# Patient Record
Sex: Female | Born: 1990 | Hispanic: Yes | Marital: Single | Smoking: Former smoker
Health system: Southern US, Community
[De-identification: ages and names within clinical notes are randomized; demographics above are authoritative.]

## PROBLEM LIST (undated history)

## (undated) ENCOUNTER — Inpatient Hospital Stay (HOSPITAL_COMMUNITY): Payer: Self-pay

## (undated) DIAGNOSIS — K219 Gastro-esophageal reflux disease without esophagitis: Secondary | ICD-10-CM

## (undated) DIAGNOSIS — K5792 Diverticulitis of intestine, part unspecified, without perforation or abscess without bleeding: Secondary | ICD-10-CM

## (undated) DIAGNOSIS — I10 Essential (primary) hypertension: Secondary | ICD-10-CM

## (undated) HISTORY — PX: OOPHORECTOMY: SHX86

## (undated) HISTORY — PX: CHOLECYSTECTOMY: SHX55

---

## 2015-12-25 DIAGNOSIS — N926 Irregular menstruation, unspecified: Secondary | ICD-10-CM | POA: Insufficient documentation

## 2016-03-26 DIAGNOSIS — K219 Gastro-esophageal reflux disease without esophagitis: Secondary | ICD-10-CM | POA: Insufficient documentation

## 2016-07-04 DIAGNOSIS — R030 Elevated blood-pressure reading, without diagnosis of hypertension: Secondary | ICD-10-CM | POA: Insufficient documentation

## 2016-07-19 ENCOUNTER — Inpatient Hospital Stay (HOSPITAL_COMMUNITY)
Admission: AD | Admit: 2016-07-19 | Discharge: 2016-07-19 | Disposition: A | Discharge status: Home / Self Care | Payer: Medicaid Other | Source: Ambulatory Visit | Attending: Obstetrics and Gynecology | Admitting: Obstetrics and Gynecology

## 2016-07-19 ENCOUNTER — Encounter (HOSPITAL_COMMUNITY): Payer: Self-pay | Admitting: *Deleted

## 2016-07-19 ENCOUNTER — Inpatient Hospital Stay (HOSPITAL_COMMUNITY): Payer: Medicaid Other

## 2016-07-19 DIAGNOSIS — O26891 Other specified pregnancy related conditions, first trimester: Secondary | ICD-10-CM | POA: Diagnosis not present

## 2016-07-19 DIAGNOSIS — Z3A10 10 weeks gestation of pregnancy: Secondary | ICD-10-CM | POA: Insufficient documentation

## 2016-07-19 DIAGNOSIS — R102 Pelvic and perineal pain: Secondary | ICD-10-CM | POA: Insufficient documentation

## 2016-07-19 DIAGNOSIS — Z6791 Unspecified blood type, Rh negative: Secondary | ICD-10-CM

## 2016-07-19 DIAGNOSIS — N939 Abnormal uterine and vaginal bleeding, unspecified: Secondary | ICD-10-CM | POA: Diagnosis present

## 2016-07-19 DIAGNOSIS — O209 Hemorrhage in early pregnancy, unspecified: Secondary | ICD-10-CM | POA: Diagnosis not present

## 2016-07-19 HISTORY — DX: Diverticulitis of intestine, part unspecified, without perforation or abscess without bleeding: K57.92

## 2016-07-19 HISTORY — DX: Essential (primary) hypertension: I10

## 2016-07-19 LAB — URINALYSIS, ROUTINE W REFLEX MICROSCOPIC
Bilirubin Urine: NEGATIVE
Glucose, UA: NEGATIVE mg/dL
Ketones, ur: NEGATIVE mg/dL
NITRITE: POSITIVE — AB
Protein, ur: 100 mg/dL — AB
pH: 5 (ref 5.0–8.0)

## 2016-07-19 LAB — CBC
HEMATOCRIT: 36.8 % (ref 36.0–46.0)
HEMOGLOBIN: 13 g/dL (ref 12.0–15.0)
MCH: 30.5 pg (ref 26.0–34.0)
MCHC: 35.3 g/dL (ref 30.0–36.0)
MCV: 86.4 fL (ref 78.0–100.0)
Platelets: 248 10*3/uL (ref 150–400)
RBC: 4.26 MIL/uL (ref 3.87–5.11)
RDW: 13.1 % (ref 11.5–15.5)
WBC: 8.4 10*3/uL (ref 4.0–10.5)

## 2016-07-19 LAB — WET PREP, GENITAL
CLUE CELLS WET PREP: NONE SEEN
SPERM: NONE SEEN
TRICH WET PREP: NONE SEEN
YEAST WET PREP: NONE SEEN

## 2016-07-19 LAB — URINE MICROSCOPIC-ADD ON

## 2016-07-19 LAB — HCG, QUANTITATIVE, PREGNANCY: hCG, Beta Chain, Quant, S: 46215 m[IU]/mL — ABNORMAL HIGH (ref <5)

## 2016-07-19 LAB — POCT PREGNANCY, URINE: PREG TEST UR: POSITIVE — AB

## 2016-07-19 MED ORDER — RHO D IMMUNE GLOBULIN 1500 UNIT/2ML IJ SOSY
300.0000 ug | PREFILLED_SYRINGE | Freq: Once | INTRAMUSCULAR | Status: AC
  Administered 2016-07-19: 300 ug via INTRAMUSCULAR
  Filled 2016-07-19: qty 2

## 2016-07-19 NOTE — MAU Note (Signed)
Pt has had pink discharge with wiping for the last 3 weeks, today has become heavier & bright red.  Also having lower abd pain.  Pos HPT 4 weeks ago, also pos UPT [facility].

## 2016-07-19 NOTE — Discharge Instructions (Signed)
Hemorragia vaginal durante el embarazo (primer trimestre)  (Vaginal Bleeding During Pregnancy, First Trimester)  Durante los primeros meses del embarazo es relativamente frecuente que se presente una pequeña hemorragia (manchas). Esta situación generalmente mejora por sí misma. Estas hemorragias o manchas tienen diversas causas al inicio del embarazo. Algunas manchas pueden estar relacionadas al embarazo y otras no. En la mayoría de los casos, la hemorragia es normal y no es un problema. Sin embargo, la hemorragia también puede ser un signo de algo grave. Debe informar a su médico de inmediato si tiene alguna hemorragia vaginal.  Algunas causas posibles de hemorragia vaginal durante el primer trimestre incluyen:  · Infección o inflamación del cuello del útero.  · Crecimientos anormales (pólipos) en el cuello del útero.  · Aborto espontáneo o amenaza de aborto espontáneo.  · Tejido del embarazo se ha desarrollado fuera del útero y en una trompa de falopio (embarazo ectópico).  · Se han desarrollado pequeños quistes en el útero en lugar de tejido de embarazo (embarazo molar).  INSTRUCCIONES PARA EL CUIDADO EN EL HOGAR   Controle su afección para ver si hay cambios. Las siguientes indicaciones ayudarán a aliviar cualquier molestia que pueda sentir:  · Siga las indicaciones del médico para restringir su actividad. Si el médico le indica descanso en la cama, debe quedarse en la cama y levantarse solo para ir al baño. No obstante, el médico puede permitirle que continué con tareas livianas.  · Si es necesario, organícese para que alguien le ayude con las actividades y responsabilidades cotidianas mientras está en cama.  · Lleve un registro de la cantidad y la saturación de las toallas higiénicas que utiliza cada día. Anote este dato.  · No use tampones.No se haga duchas vaginales.  · No tenga relaciones sexuales u orgasmos hasta que el médico la autorice.  · Si elimina tejido por la vagina, guárdelo para mostrárselo al  médico.  · Tome solo medicamentos de venta libre o recetados, según las indicaciones del médico.  · No tome aspirina, ya que puede causar hemorragias.  · Cumpla con todas las visitas de control, según le indique su médico.  SOLICITE ATENCIÓN MÉDICA SI:  · Tiene un sangrado vaginal en cualquier momento del embarazo.  · Tiene calambres o dolores de parto.  · Tiene fiebre que los medicamentos no pueden controlar.  SOLICITE ATENCIÓN MÉDICA DE INMEDIATO SI:   · Siente calambres intensos en la espalda o en el vientre (abdomen).  · Elimina coágulos grandes o tejido por la vagina.  · La hemorragia aumenta.  · Si se siente mareada, débil o se desmaya.  · Tiene escalofríos.  · Tiene una pérdida importante o sale líquido a borbotones por la vagina.  · Se desmaya mientras defeca.  ASEGÚRESE DE QUE:  · Comprende estas instrucciones.  · Controlará su afección.  · Recibirá ayuda de inmediato si no mejora o si empeora.     Esta información no tiene como fin reemplazar el consejo del médico. Asegúrese de hacerle al médico cualquier pregunta que tenga.     Document Released: 07/02/2005 Document Revised: 09/27/2013  Elsevier Interactive Patient Education ©2016 Elsevier Inc.

## 2016-07-19 NOTE — MAU Provider Note (Signed)
Chief Complaint: Vaginal Bleeding and Abdominal Pain   First Provider Initiated Contact with Patient 07/19/16 2056      SUBJECTIVE HPI: Kristine Cordova is a 25 y.o. G2P1001 at [redacted]w[redacted]d by LMP who presents to maternity admissions reporting bleeding x 3 weeks that was light and pink, only when wiping, but became heavy and dark red today. The bleeding is associated with mild cramping that is intermittent. She has not tried any treatments, nothing makes the bleeding better or worse.  She has not had intercourse since bleeding started.   She denies vaginal itching/burning, urinary symptoms, h/a, dizziness, n/v, or fever/chills.     HPI  Past Medical History:  Diagnosis Date  . Diverticulitis   . Hypertension    Past Surgical History:  Procedure Laterality Date  . CHOLECYSTECTOMY    . OOPHORECTOMY     Social History   Social History  . Marital status: Single    Spouse name: N/A  . Number of children: N/A  . Years of education: N/A   Occupational History  . Not on file.   Social History Main Topics  . Smoking status: Never Smoker  . Smokeless tobacco: Never Used  . Alcohol use No  . Drug use: No  . Sexual activity: Yes   Other Topics Concern  . Not on file   Social History Narrative  . No narrative on file   No current facility-administered medications on file prior to encounter.    No current outpatient prescriptions on file prior to encounter.   Allergies not on file  ROS:  Review of Systems  Constitutional: Negative for chills, fatigue and fever.  Respiratory: Negative for shortness of breath.   Cardiovascular: Negative for chest pain.  Genitourinary: Positive for pelvic pain and vaginal bleeding. Negative for difficulty urinating, dysuria, flank pain, vaginal discharge and vaginal pain.  Neurological: Negative for dizziness and headaches.  Psychiatric/Behavioral: Negative.      I have reviewed patient's Past Medical Hx, Surgical Hx, Family Hx, Social Hx,  medications and allergies.   Physical Exam   Patient Vitals for the past 24 hrs:  BP Pulse Resp  07/19/16 2325 117/71 79 18   Constitutional: Well-developed, well-nourished female in no acute distress.  Cardiovascular: normal rate Respiratory: normal effort GI: Abd soft, non-tender. Pos BS x 4 MS: Extremities nontender, no edema, normal ROM Neurologic: Alert and oriented x 4.  GU: Neg CVAT.  PELVIC EXAM: Cervix pink, visually closed, without lesion, moderate amount dark red lbeeding, vaginal walls and external genitalia normal Bimanual exam: Cervix 0/long/high, firm, anterior, neg CMT, uterus nontender, slightly enlarged, adnexa without tenderness, enlargement, or mass   LAB RESULTS  Results for orders placed or performed during the hospital encounter of 07/19/16 (from the past 48 hour(s))  Urinalysis, Routine w reflex microscopic (not at [facility])     Status: Abnormal   Collection Time: 07/19/16  7:48 PM  Result Value Ref Range   Color, Urine RED (A) YELLOW    Comment: BIOCHEMICALS MAY BE AFFECTED BY COLOR   APPearance TURBID (A) CLEAR   Specific Gravity, Urine >1.030 (H) 1.005 - 1.030   pH 5.0 5.0 - 8.0   Glucose, UA NEGATIVE NEGATIVE mg/dL   Hgb urine dipstick LARGE (A) NEGATIVE   Bilirubin Urine NEGATIVE NEGATIVE   Ketones, ur NEGATIVE NEGATIVE mg/dL   Protein, ur 045 (A) NEGATIVE mg/dL   Nitrite POSITIVE (A) NEGATIVE   Leukocytes, UA TRACE (A) NEGATIVE  Urine microscopic-add on     Status: Abnormal  Collection Time: 07/19/16  7:48 PM  Result Value Ref Range   Squamous Epithelial / LPF 6-30 (A) NONE SEEN   WBC, UA 0-5 0 - 5 WBC/hpf   RBC / HPF TOO NUMEROUS TO COUNT 0 - 5 RBC/hpf   Bacteria, UA MANY (A) NONE SEEN   Urine-Other MUCOUS PRESENT   Pregnancy, urine POC     Status: Abnormal   Collection Time: 07/19/16  7:57 PM  Result Value Ref Range   Preg Test, Ur POSITIVE (A) NEGATIVE    Comment:        THE SENSITIVITY OF THIS METHODOLOGY IS >24 mIU/mL   CBC      Status: None   Collection Time: 07/19/16  8:25 PM  Result Value Ref Range   WBC 8.4 4.0 - 10.5 K/uL   RBC 4.26 3.87 - 5.11 MIL/uL   Hemoglobin 13.0 12.0 - 15.0 g/dL   HCT 16.136.8 09.636.0 - 04.546.0 %   MCV 86.4 78.0 - 100.0 fL   MCH 30.5 26.0 - 34.0 pg   MCHC 35.3 30.0 - 36.0 g/dL   RDW 40.913.1 81.111.5 - 91.415.5 %   Platelets 248 150 - 400 K/uL  hCG, quantitative, pregnancy     Status: Abnormal   Collection Time: 07/19/16  8:25 PM  Result Value Ref Range   hCG, Beta Chain, Quant, S 46,215 (H) <5 mIU/mL    Comment:          GEST. AGE      CONC.  (mIU/mL)   <=1 WEEK        5 - 50     2 WEEKS       50 - 500     3 WEEKS       100 - 10,000     4 WEEKS     1,000 - 30,000     5 WEEKS     3,500 - 115,000   6-8 WEEKS     12,000 - 270,000    12 WEEKS     15,000 - 220,000        FEMALE AND NON-PREGNANT FEMALE:     LESS THAN 5 mIU/mL   HIV antibody     Status: None   Collection Time: 07/19/16  8:25 PM  Result Value Ref Range   HIV Screen 4th Generation wRfx Non Reactive Non Reactive    Comment: (NOTE) Performed At: [facility] [address] [place], [place] [id number] Mila HomerHancock William F MD YQ:[id number]:[phone]   ABO/Rh     Status: None   Collection Time: 07/19/16  8:25 PM  Result Value Ref Range   ABO/RH(D) O NEG   Rh IG workup (includes ABO/Rh)     Status: None   Collection Time: 07/19/16  8:25 PM  Result Value Ref Range   Gestational Age(Wks) 9    ABO/RH(D) O NEG    Antibody Screen NEG    Unit Number [id number]    Blood Component Type RHIG    Unit division 00    Status of Unit ISSUED,FINAL    Transfusion Status OK TO TRANSFUSE   Wet prep, genital     Status: Abnormal   Collection Time: 07/19/16  8:55 PM  Result Value Ref Range   Yeast Wet Prep HPF POC NONE SEEN NONE SEEN   Trich, Wet Prep NONE SEEN NONE SEEN   Clue Cells Wet Prep HPF POC NONE SEEN NONE SEEN   WBC, Wet Prep HPF POC FEW (A) NONE SEEN  Comment: MODERATE BACTERIA SEEN   Sperm NONE SEEN    --/--/O NEG, O NEG (10/14  2025)  IMAGING US Ob Comp Less 14 Wks  Result Date: 07/19/2016 CLINICAL DATA:  Acute onset of vaginal bleeding.  Initial encounter. EXAM: OBSTETRIC <14 WK [place] AND TRANSVAGINAL OB US TECHNIQUE: Both transabdominal and transvaginal ultrasound examinations were performed for complete evaluation of the gestation as well as the maternal uterus, adnexal regions, and pelvic cul-de-sac. Transvaginal technique was performed to assess early pregnancy. COMPARISON:  CT of the abdomen and pelvis from 04/05/2016 FINDINGS: Intrauterine gestational sac: Single; visualized and normal in shape. Yolk sac:  Yes Embryo:  Yes Cardiac Activity: Yes Heart Rate: 161  bpm CRL:  2.5 cm   9 w   1 d                  [place] EDC: 02/20/2017 Subchorionic hemorrhage:  None visualized. Maternal uterus/adnexae: The uterus is otherwise unremarkable. The patient is status post right-sided oophorectomy. The left ovary is unremarkable in appearance, measuring 4.4 x 3.2 x 4.0 cm. No suspicious adnexal masses are seen; there is no evidence for ovarian torsion. No free fluid is seen within the pelvic cul-de-sac. IMPRESSION: Single live intrauterine pregnancy noted, with a crown-rump length of 2.5 cm, reflecting a gestational age of [redacted] weeks 1 day. This does not match the gestational age by LMP, and reflects a new estimated date of delivery of Feb 20, 2017. Electronically Signed   By: Roanna Raider M.D.   On: 07/19/2016 21:58   US Ob Transvaginal  Result Date: 07/19/2016 CLINICAL DATA:  Acute onset of vaginal bleeding.  Initial encounter. EXAM: OBSTETRIC <14 WK [place] AND TRANSVAGINAL OB US TECHNIQUE: Both transabdominal and transvaginal ultrasound examinations were performed for complete evaluation of the gestation as well as the maternal uterus, adnexal regions, and pelvic cul-de-sac. Transvaginal technique was performed to assess early pregnancy. COMPARISON:  CT of the abdomen and pelvis from 04/05/2016 FINDINGS: Intrauterine gestational sac: Single;  visualized and normal in shape. Yolk sac:  Yes Embryo:  Yes Cardiac Activity: Yes Heart Rate: 161  bpm CRL:  2.5 cm   9 w   1 d                  [place] EDC: 02/20/2017 Subchorionic hemorrhage:  None visualized. Maternal uterus/adnexae: The uterus is otherwise unremarkable. The patient is status post right-sided oophorectomy. The left ovary is unremarkable in appearance, measuring 4.4 x 3.2 x 4.0 cm. No suspicious adnexal masses are seen; there is no evidence for ovarian torsion. No free fluid is seen within the pelvic cul-de-sac. IMPRESSION: Single live intrauterine pregnancy noted, with a crown-rump length of 2.5 cm, reflecting a gestational age of [redacted] weeks 1 day. This does not match the gestational age by LMP, and reflects a new estimated date of delivery of Feb 20, 2017. Electronically Signed   By: Roanna Raider M.D.   On: 07/19/2016 21:58    MAU Management/MDM: Ordered labs and reviewed results.  IUP with normal FHR noted on [place] today.  No evidence of subchorionic hemorrhage but possibly a small SCH resolved with today's bleeding.  Pt to start prenatal care at [facility].  Treatments in MAU included Rhophylac dose given. Urine positive for nitrites but with significant blood from vaginal bleeding.  Sent for culture. Pt stable at time of discharge.  ASSESSMENT 1. Vaginal bleeding in pregnancy, first trimester   2. Rh negative state in antepartum period, first trimester  PLAN Discharge home   Medication List    You have not been prescribed any medications.    Follow-up Information    Center for [facility] .   Why:  The office will call you with an appointment.  Return to MAU for emergencies. Contact information: [address]  [address]  [place], [place] [address]  [phone]           Sharen Counter Certified Nurse-Midwife 07/20/2016  8:55 PM

## 2016-07-20 LAB — RH IG WORKUP (INCLUDES ABO/RH)
ABO/RH(D): O NEG
Antibody Screen: NEGATIVE
GESTATIONAL AGE(WKS): 9
Unit division: 0

## 2016-07-20 LAB — HIV ANTIBODY (ROUTINE TESTING W REFLEX): HIV SCREEN 4TH GENERATION: NONREACTIVE

## 2016-07-20 LAB — ABO/RH: ABO/RH(D): O NEG

## 2016-07-21 LAB — GC/CHLAMYDIA PROBE AMP (~~LOC~~) NOT AT ARMC
CHLAMYDIA, DNA PROBE: NEGATIVE
NEISSERIA GONORRHEA: NEGATIVE

## 2016-07-28 DIAGNOSIS — B951 Streptococcus, group B, as the cause of diseases classified elsewhere: Secondary | ICD-10-CM | POA: Insufficient documentation

## 2016-07-28 DIAGNOSIS — O2340 Unspecified infection of urinary tract in pregnancy, unspecified trimester: Secondary | ICD-10-CM

## 2016-08-06 ENCOUNTER — Other Ambulatory Visit: Payer: Self-pay | Admitting: Family Medicine

## 2016-08-06 ENCOUNTER — Encounter: Payer: Self-pay | Admitting: Family Medicine

## 2016-08-06 ENCOUNTER — Other Ambulatory Visit (HOSPITAL_COMMUNITY)
Admission: RE | Admit: 2016-08-06 | Discharge: 2016-08-06 | Disposition: A | Discharge status: Home / Self Care | Payer: Medicaid Other | Source: Ambulatory Visit | Attending: Family Medicine | Admitting: Family Medicine

## 2016-08-06 ENCOUNTER — Ambulatory Visit (INDEPENDENT_AMBULATORY_CARE_PROVIDER_SITE_OTHER): Payer: Medicaid Other | Admitting: Family Medicine

## 2016-08-06 VITALS — BP 118/79 | HR 88 | Ht 64.0 in | Wt 238.0 lb

## 2016-08-06 DIAGNOSIS — Z113 Encounter for screening for infections with a predominantly sexual mode of transmission: Secondary | ICD-10-CM

## 2016-08-06 DIAGNOSIS — O26899 Other specified pregnancy related conditions, unspecified trimester: Principal | ICD-10-CM

## 2016-08-06 DIAGNOSIS — O26891 Other specified pregnancy related conditions, first trimester: Secondary | ICD-10-CM

## 2016-08-06 DIAGNOSIS — Z3481 Encounter for supervision of other normal pregnancy, first trimester: Secondary | ICD-10-CM

## 2016-08-06 DIAGNOSIS — N898 Other specified noninflammatory disorders of vagina: Secondary | ICD-10-CM | POA: Diagnosis not present

## 2016-08-06 DIAGNOSIS — Z3480 Encounter for supervision of other normal pregnancy, unspecified trimester: Secondary | ICD-10-CM | POA: Insufficient documentation

## 2016-08-06 NOTE — Progress Notes (Signed)
  Subjective:    Kristine SlaterJessica Cordova is a G2P1001 4676w5d being seen today for her first obstetrical visit.  Her obstetrical history is significant for none. Patient does intend to breast feed. Pregnancy history fully reviewed.  Patient reports green vaginal discharge.  Vitals:   08/06/16 1556 08/06/16 1601  BP: 118/79   Pulse: 88   Weight: 238 lb (108 kg)   Height:  5\' 4"  (1.626 m)    HISTORY: OB History  Gravida Para Term Preterm AB Living  2 1 1     1   SAB TAB Ectopic Multiple Live Births               # Outcome Date GA Lbr Len/2nd Weight Sex Delivery Anes PTL Lv  2 Current           1 Term    8 lb 9 oz (3.884 kg)  Vag-Spont        Past Medical History:  Diagnosis Date  . Diverticulitis   . Hypertension    Past Surgical History:  Procedure Laterality Date  . CHOLECYSTECTOMY    . OOPHORECTOMY     Family History  Problem Relation Age of Onset  . Cancer Maternal Grandfather   . Diabetes Paternal Grandmother      Exam    Uterus:     Pelvic Exam:    Perineum: No Hemorrhoids, Normal Perineum   Vulva: normal, Bartholin's, Urethra, Skene's normal   Vagina:  normal mucosa   Cervix: multiparous appearance   Adnexa: normal adnexa and no mass, fullness, tenderness   Bony Pelvis: gynecoid  System: Breast:  normal appearance, no masses or tenderness   Skin: normal coloration and turgor, no rashes    Neurologic: oriented, gait normal; reflexes normal and symmetric   Extremities: normal strength, tone, and muscle mass   HEENT PERRLA and extra ocular movement intact   Mouth/Teeth mucous membranes moist, pharynx normal without lesions   Neck supple and no masses   Cardiovascular: regular rate and rhythm, no murmurs or gallops   Respiratory:  appears well, vitals normal, no respiratory distress, acyanotic, normal RR, ear and throat exam is normal, neck free of mass or lymphadenopathy, chest clear, no wheezing, crepitations, rhonchi, normal symmetric air entry   Abdomen: soft,  non-tender; bowel sounds normal; no masses,  no organomegaly   Urinary: urethral meatus normal      Assessment:    Pregnancy: G2P1001 Patient Active Problem List   Diagnosis Date Noted  . Supervision of other normal pregnancy, antepartum 08/06/2016        Plan:     Initial labs drawn. Prenatal vitamins. Problem list reviewed and updated. Genetic Screening discussed Quad Screen: ordered.  Ultrasound discussed; fetal survey: requested.  Follow up in 4 weeks. 50% of 30 min visit spent on counseling and coordination of care.  Affirm testing done.   Candelaria CelesteSTINSON, JACOB JEHIEL 08/06/2016

## 2016-08-06 NOTE — Patient Instructions (Signed)
Levonorgestrel intrauterine device (IUD) Qu es este medicamento? El LEVONORGESTREL (DIU) es un dispositivo anticonceptivo (control de natalidad). El dispositivo se coloca dentro del tero por un profesional de la salud. Se utiliza para evitar el embarazo y tambin se puede utilizar para tratar el sangrado abundante que ocurre durante su perodo. Dependiendo del dispositivo, se puede utilizar por 3 a 5 aos. Este medicamento puede ser utilizado para otros usos; si tiene alguna pregunta consulte con su proveedor de atencin mdica o con su farmacutico. Qu le debo informar a mi profesional de la salud antes de tomar este medicamento? Necesita saber si usted presenta alguno de los siguientes problemas o situaciones: -exmen de Papanicolaou anormal -cncer de mama, cuello del tero o tero -diabetes -endometritis -si tiene una infeccin plvica o genital actual o en el pasado -tiene ms de una pareja sexual o si su pareja tiene ms de una pareja -enfermedad cardiaca -antecedente de embarazo tubrico o ectpico -problemas del sistema inmunolgico -DIU colocado -enfermedad heptica o tumor del hgado -problemas con la coagulacin o si toma diluyentes sanguneos -usa medicamentos intravenoso -forma inusual del tero -sangrado vaginal que no tiene explicacin -una reaccin alrgica o inusual al levonorgestrel, a otras hormonas, a la silicona o polietilenos, a otros medicamentos, alimentos, colorantes o conservantes -si est embarazada o buscando quedar embarazada -si est amamantando a un beb Cmo debo utilizar este medicamento? Un profesional de la salud coloca este dispositivo en el tero. Hable con su pediatra para informarse acerca del uso de este medicamento en nios. Puede requerir atencin especial. Sobredosis: Pngase en contacto inmediatamente con un centro toxicolgico o una sala de urgencia si usted cree que haya tomado demasiado medicamento. ATENCIN: Este medicamento es solo  para usted. No comparta este medicamento con nadie. Qu sucede si me olvido de una dosis? No se aplica en este caso. Qu puede interactuar con este medicamento? No tome esta medicina con ninguno de los siguientes medicamentos: -amprenavir -bosentano -fosamprenavir Esta medicina tambin puede interactuar con los siguientes medicamentos: -aprepitant -barbitricos para producir el sueo o para el tratamiento de convulsiones -bexaroteno -griseofulvina -medicamentos para tratar los convulsiones, tales como carbamazepina, etotona, felbamato, oxcarbazepina, fenitona, topiramato -modafinilo -pioglitazona -rifabutina -rifampicina -rifapentina -algunos medicamentos para tratar el virus VIH, tales como atazanavir, indinavir, lopinavir, nelfinavir, tipranavir, ritonavir -hierba de [place] -warfarina Puede ser que esta lista no menciona todas las posibles interacciones. Informe a su profesional de la salud de todos los productos a base de hierbas, medicamentos de venta libre o suplementos nutritivos que est tomando. Si usted fuma, consume bebidas alcohlicas o si utiliza drogas ilegales, indqueselo tambin a su profesional de la salud. Algunas sustancias pueden interactuar con su medicamento. A qu debo estar atento al usar este medicamento? Visite a su mdico o a su profesional de la salud para chequear su evolucin peridicamente. Visite a su mdico si usted o su pareja tiene relaciones sexuales con otras personas, se vuelve VIH positivo o contrae una enfermedad de transmisin sexual. Este medicamento no la protege de la infeccin por VIH (SIDA) ni de ninguna otra enfermedad de transmisin sexual. Puede controlar la ubicacin del DIU usted misma palpando con sus dedos limpios los hilos en la parte anterior de la vagina. No tire de los hilos. Es un buen hbito controlar la ubicacin del dispositivo despus de cada perodo menstrual. Si no slo siente los hilos sino que adems siente otra parte  ms del DIU o si no puede sentir los hilos, consulte a su mdico inmediatamente. El DIU puede salirse   por s solo. Puede quedar embarazada si el dispositivo se sale de su lugar. Utilice un mtodo anticonceptivo adicional, como preservativos, y consulte a su proveedor de atencin mdica s observa que el DIU se sali de su lugar. La utilizacin de tampones no cambia la posicin del DIU y no hay inconvenientes en usarlos durante su perodo. Qu efectos secundarios puedo tener al utilizar este medicamento? Efectos secundarios que debe informar a su mdico o a su profesional de la salud tan pronto como sea posible: -reacciones alrgicas como erupcin cutnea, picazn o urticarias, hinchazn de la cara, labios o lengua -fiebre, sntomas gripales -llagas genitales -alta presin sangunea -ausencia de un perodo menstrual durante 6 semanas mientras lo utiliza -dolor, hinchazn o calor en las piernas -dolor o sensibilidad del plvico -dolor de cabeza repentino o severo -signos de embarazo -calambres estomacales -falta de aliento repentina -problemas de coordinacin, del habla, al caminar -sangrado, flujo vaginal inusual -color amarillento de los ojos o la piel Efectos secundarios que, por lo general, no requieren atencin mdica (debe informarlos a su mdico o a su profesional de la salud si persisten o si son molestos): -acn -dolor de pecho -cambios en el deseo sexual o capacidad -cambios de peso -calambres, mareos o sensacin de desmayo mientras se introduce el dispositivo -dolor de cabeza -sangrado menstruales irregulares en los primeros 3 a 6 meses de usar -nuseas Puede ser que esta lista no menciona todos los posibles efectos secundarios. Comunquese a su mdico por asesoramiento mdico sobre los efectos secundarios. Usted puede informar los efectos secundarios a la FDA por telfono al 1-800-FDA-1088. Dnde debo guardar mi medicina? No se aplica en este caso. ATENCIN: Este folleto es  un resumen. Puede ser que no cubra toda la posible informacin. Si usted tiene preguntas acerca de esta medicina, consulte con su mdico, su farmacutico o su profesional de la salud.    2016, Elsevier/Gold Standard. (2014-11-14 00:00:00)  

## 2016-08-06 NOTE — Progress Notes (Signed)
Having pink spotting and has been seen at [facility].  Today also c/o's "greenish D/C"

## 2016-08-06 NOTE — Progress Notes (Signed)
Bedside ultrasound revealed + FHR= 143bpm. Armandina StammerJennifer Pruitt Taboada RNBSN

## 2016-08-07 LAB — OBSTETRIC PANEL
Antibody Screen: POSITIVE — AB
Basophils Absolute: 0 cells/uL (ref 0–200)
Basophils Relative: 0 %
Eosinophils Absolute: 152 cells/uL (ref 15–500)
Eosinophils Relative: 2 %
HCT: 37 % (ref 35.0–45.0)
Hemoglobin: 12.9 g/dL (ref 11.7–15.5)
Hepatitis B Surface Ag: NEGATIVE
Lymphocytes Relative: 27 %
Lymphs Abs: 2052 cells/uL (ref 850–3900)
MCH: 30.6 pg (ref 27.0–33.0)
MCHC: 34.9 g/dL (ref 32.0–36.0)
MCV: 87.9 fL (ref 80.0–100.0)
MPV: 10.4 fL (ref 7.5–12.5)
Monocytes Absolute: 608 cells/uL (ref 200–950)
Monocytes Relative: 8 %
Neutro Abs: 4788 cells/uL (ref 1500–7800)
Neutrophils Relative %: 63 %
Platelets: 231 10*3/uL (ref 140–400)
RBC: 4.21 MIL/uL (ref 3.80–5.10)
RDW: 13.5 % (ref 11.0–15.0)
Rh Type: NEGATIVE
Rubella: 15.1 Index — ABNORMAL HIGH (ref <0.90)
WBC: 7.6 10*3/uL (ref 3.8–10.8)

## 2016-08-07 LAB — PRENATAL ANTIBODY IDENTIFICATION

## 2016-08-07 LAB — WET PREP BY MOLECULAR PROBE
Candida species: NEGATIVE
Gardnerella vaginalis: POSITIVE — AB
Trichomonas vaginosis: NEGATIVE

## 2016-08-07 LAB — HIV ANTIBODY (ROUTINE TESTING W REFLEX): HIV: NONREACTIVE

## 2016-08-07 LAB — ANTIBODY TITER (PRENATAL TITER): Ab Titer: <2

## 2016-08-08 ENCOUNTER — Telehealth: Payer: Self-pay

## 2016-08-08 ENCOUNTER — Other Ambulatory Visit: Payer: Self-pay | Admitting: Family Medicine

## 2016-08-08 ENCOUNTER — Encounter: Payer: Self-pay | Admitting: Family Medicine

## 2016-08-08 DIAGNOSIS — Z6791 Unspecified blood type, Rh negative: Secondary | ICD-10-CM | POA: Insufficient documentation

## 2016-08-08 DIAGNOSIS — O26899 Other specified pregnancy related conditions, unspecified trimester: Secondary | ICD-10-CM

## 2016-08-08 LAB — GC/CHLAMYDIA PROBE AMP (~~LOC~~) NOT AT ARMC
CHLAMYDIA, DNA PROBE: NEGATIVE
Neisseria Gonorrhea: NEGATIVE

## 2016-08-08 MED ORDER — METRONIDAZOLE 500 MG PO TABS: 500.0000 mg | ORAL_TABLET | Freq: Two times a day (BID) | ORAL | 0 refills | Status: DC

## 2016-08-08 NOTE — Telephone Encounter (Signed)
Patient made aware that she still has infection and needs to be treated. Patient states understanding and will pick up medicine. Kristine StammerJennifer Deina Lipsey RNBSN

## 2016-08-08 NOTE — Telephone Encounter (Signed)
-----   Message from Levie HeritageJacob J Stinson, DO sent at 08/08/2016  8:16 AM EDT ----- Has BV - please notify. Flagyl 500mg  BID x 7 days prescribed.

## 2016-08-09 LAB — PAIN MGMT, OPIATES EXP. QN, UR
CODEINE: NEGATIVE ng/mL (ref <50)
HYDROCODONE: NEGATIVE ng/mL (ref <50)
Hydromorphone: NEGATIVE ng/mL (ref <50)
Morphine: NEGATIVE ng/mL (ref <50)
NORHYDROCODONE: NEGATIVE ng/mL (ref <50)
Noroxycodone: NEGATIVE ng/mL (ref <50)
OXYMORPHONE: NEGATIVE ng/mL (ref <50)
Oxycodone: NEGATIVE ng/mL (ref <50)

## 2016-08-09 LAB — CULTURE, URINE COMPREHENSIVE

## 2016-09-03 ENCOUNTER — Encounter: Payer: Self-pay | Admitting: Obstetrics & Gynecology

## 2016-09-03 ENCOUNTER — Ambulatory Visit (INDEPENDENT_AMBULATORY_CARE_PROVIDER_SITE_OTHER): Payer: Medicaid Other | Admitting: Obstetrics & Gynecology

## 2016-09-03 VITALS — BP 129/75 | HR 90 | Wt 235.0 lb

## 2016-09-03 DIAGNOSIS — J029 Acute pharyngitis, unspecified: Secondary | ICD-10-CM

## 2016-09-03 DIAGNOSIS — O99212 Obesity complicating pregnancy, second trimester: Secondary | ICD-10-CM

## 2016-09-03 DIAGNOSIS — O99210 Obesity complicating pregnancy, unspecified trimester: Secondary | ICD-10-CM

## 2016-09-03 DIAGNOSIS — Z3480 Encounter for supervision of other normal pregnancy, unspecified trimester: Secondary | ICD-10-CM

## 2016-09-03 DIAGNOSIS — Z3482 Encounter for supervision of other normal pregnancy, second trimester: Secondary | ICD-10-CM

## 2016-09-03 DIAGNOSIS — E669 Obesity, unspecified: Secondary | ICD-10-CM

## 2016-09-03 NOTE — Progress Notes (Signed)
   PRENATAL VISIT NOTE  Subjective:  Kristine Cordova is a 25 y.o. H 412P1001(27 yo son at home)  at 8265w5d being seen today for ongoing prenatal care.  She is currently monitored for the following issues for this low-risk pregnancy and has Supervision of other normal pregnancy, antepartum and Rh negative state in antepartum period on her problem list.  Patient reports sore throat, cough for a week, her son was sick also..   Kristine Cordova. Vag. Bleeding: None.  Movement: Absent. Denies leaking of fluid.   The following portions of the patient's history were reviewed and updated as appropriate: allergies, current medications, past family history, past medical history, past social history, past surgical history and problem list. Problem list updated.  Objective:   Vitals:   09/03/16 1417  BP: 129/75  Pulse: 90  Weight: 235 lb (106.6 kg)    Fetal Status:     Movement: Absent     General:  Alert, oriented and cooperative. Patient is in no acute distress.  Skin: Skin is warm and dry. No rash noted.   Cardiovascular: Normal heart rate noted  Respiratory: Normal respiratory effort, no problems with respiration noted  Abdomen: Soft, gravid, appropriate for gestational age.       Pelvic:  Cervical exam deferred        Extremities: Normal range of motion.  Edema: None  Mental Status: Normal mood and affect. Normal behavior. Normal judgment and thought content.   Assessment and Plan:  Pregnancy: G2P1001 at 2965w5d  1. Supervision of other normal pregnancy, antepartum  - US MFM OB COMP + 14 WK; Future - Quad screen at next visit  2. Obesity in pregnancy  - Hemoglobin A1c - Glucose 3. Sore throat- check throat culture  Preterm labor symptoms and general obstetric precautions including but not limited to vaginal bleeding, contractions, leaking of fluid and fetal movement were reviewed in detail with the patient. Please refer to After Visit Summary for other counseling recommendations.  Return in about 4  weeks (around 10/01/2016) for quad screen at next visit.   Allie BossierMyra C Zamia Tyminski, MD

## 2016-09-04 LAB — GLUCOSE, RANDOM: GLUCOSE: 97 mg/dL (ref 65–99)

## 2016-09-04 LAB — HEMOGLOBIN A1C
HEMOGLOBIN A1C: 4.9 % (ref <5.7)
Mean Plasma Glucose: 94 mg/dL

## 2016-09-05 LAB — CULTURE, GROUP A STREP: ORGANISM ID, BACTERIA: NORMAL

## 2016-09-15 ENCOUNTER — Encounter (HOSPITAL_COMMUNITY): Payer: Self-pay | Admitting: Obstetrics & Gynecology

## 2016-09-22 ENCOUNTER — Ambulatory Visit (HOSPITAL_COMMUNITY)
Admission: RE | Admit: 2016-09-22 | Discharge: 2016-09-22 | Disposition: A | Discharge status: Home / Self Care | Payer: Medicaid Other | Source: Ambulatory Visit | Attending: Obstetrics & Gynecology | Admitting: Obstetrics & Gynecology

## 2016-09-22 ENCOUNTER — Other Ambulatory Visit: Payer: Self-pay | Admitting: Obstetrics & Gynecology

## 2016-09-22 DIAGNOSIS — O26892 Other specified pregnancy related conditions, second trimester: Secondary | ICD-10-CM

## 2016-09-22 DIAGNOSIS — Z6791 Unspecified blood type, Rh negative: Secondary | ICD-10-CM

## 2016-09-22 DIAGNOSIS — Z3480 Encounter for supervision of other normal pregnancy, unspecified trimester: Secondary | ICD-10-CM | POA: Diagnosis present

## 2016-09-22 DIAGNOSIS — Z3A18 18 weeks gestation of pregnancy: Secondary | ICD-10-CM

## 2016-09-22 DIAGNOSIS — O99212 Obesity complicating pregnancy, second trimester: Secondary | ICD-10-CM | POA: Diagnosis not present

## 2016-09-22 DIAGNOSIS — O360120 Maternal care for anti-D [Rh] antibodies, second trimester, not applicable or unspecified: Secondary | ICD-10-CM | POA: Insufficient documentation

## 2016-09-22 DIAGNOSIS — Z363 Encounter for antenatal screening for malformations: Secondary | ICD-10-CM

## 2016-10-01 ENCOUNTER — Ambulatory Visit (INDEPENDENT_AMBULATORY_CARE_PROVIDER_SITE_OTHER): Payer: Medicaid Other | Admitting: Obstetrics & Gynecology

## 2016-10-01 VITALS — BP 119/66 | HR 66 | Wt 238.0 lb

## 2016-10-01 DIAGNOSIS — O26899 Other specified pregnancy related conditions, unspecified trimester: Secondary | ICD-10-CM

## 2016-10-01 DIAGNOSIS — E669 Obesity, unspecified: Secondary | ICD-10-CM

## 2016-10-01 DIAGNOSIS — Z3482 Encounter for supervision of other normal pregnancy, second trimester: Secondary | ICD-10-CM

## 2016-10-01 DIAGNOSIS — Z3480 Encounter for supervision of other normal pregnancy, unspecified trimester: Secondary | ICD-10-CM

## 2016-10-01 DIAGNOSIS — Z6791 Unspecified blood type, Rh negative: Secondary | ICD-10-CM

## 2016-10-01 DIAGNOSIS — O99212 Obesity complicating pregnancy, second trimester: Secondary | ICD-10-CM

## 2016-10-01 DIAGNOSIS — O99210 Obesity complicating pregnancy, unspecified trimester: Secondary | ICD-10-CM

## 2016-10-01 NOTE — Progress Notes (Signed)
Pt states the she has been feeling dizzy and sick all day today.

## 2016-10-01 NOTE — Patient Instructions (Signed)
Second Trimester of Pregnancy The second trimester is from week 13 through week 28 (months 4 through 6). The second trimester is often a time when you feel your best. Your body has also adjusted to being pregnant, and you begin to feel better physically. Usually, morning sickness has lessened or quit completely, you may have more energy, and you may have an increase in appetite. The second trimester is also a time when the fetus is growing rapidly. At the end of the sixth month, the fetus is about 9 inches long and weighs about 1 pounds. You will likely begin to feel the baby move (quickening) between 18 and 20 weeks of the pregnancy. Body changes during your second trimester Your body continues to go through many changes during your second trimester. The changes vary from woman to woman.  Your weight will continue to increase. You will notice your lower abdomen bulging out.  You may begin to get stretch marks on your hips, abdomen, and breasts.  You may develop headaches that can be relieved by medicines. The medicines should be approved by your health care provider.  You may urinate more often because the fetus is pressing on your bladder.  You may develop or continue to have heartburn as a result of your pregnancy.  You may develop constipation because certain hormones are causing the muscles that push waste through your intestines to slow down.  You may develop hemorrhoids or swollen, bulging veins (varicose veins).  You may have back pain. This is caused by:  Weight gain.  Pregnancy hormones that are relaxing the joints in your pelvis.  A shift in weight and the muscles that support your balance.  Your breasts will continue to grow and they will continue to become tender.  Your gums may bleed and may be sensitive to brushing and flossing.  Dark spots or blotches (chloasma, mask of pregnancy) may develop on your face. This will likely fade after the baby is born.  A dark line  from your belly button to the pubic area (linea nigra) may appear. This will likely fade after the baby is born.  You may have changes in your hair. These can include thickening of your hair, rapid growth, and changes in texture. Some women also have hair loss during or after pregnancy, or hair that feels dry or thin. Your hair will most likely return to normal after your baby is born. What to expect at prenatal visits During a routine prenatal visit:  You will be weighed to make sure you and the fetus are growing normally.  Your blood pressure will be taken.  Your abdomen will be measured to track your baby's growth.  The fetal heartbeat will be listened to.  Any test results from the previous visit will be discussed. Your health care provider may ask you:  How you are feeling.  If you are feeling the baby move.  If you have had any abnormal symptoms, such as leaking fluid, bleeding, severe headaches, or abdominal cramping.  If you are using any tobacco products, including cigarettes, chewing tobacco, and electronic cigarettes.  If you have any questions. Other tests that may be performed during your second trimester include:  Blood tests that check for:  Low iron levels (anemia).  Gestational diabetes (between 24 and 28 weeks).  Rh antibodies. This is to check for a protein on red blood cells (Rh factor).  Urine tests to check for infections, diabetes, or protein in the urine.  An ultrasound to   confirm the proper growth and development of the baby.  An amniocentesis to check for possible genetic problems.  Fetal screens for spina bifida and Down syndrome.  HIV (human immunodeficiency virus) testing. Routine prenatal testing includes screening for HIV, unless you choose not to have this test. Follow these instructions at home: Eating and drinking  Continue to eat regular, healthy meals.  Avoid raw meat, uncooked cheese, cat litter boxes, and soil used by cats. These  carry germs that can cause birth defects in the baby.  Take your prenatal vitamins.  Take 1500-2000 mg of calcium daily starting at the 20th week of pregnancy until you deliver your baby.  If you develop constipation:  Take over-the-counter or prescription medicines.  Drink enough fluid to keep your urine clear or pale yellow.  Eat foods that are high in fiber, such as fresh fruits and vegetables, whole grains, and beans.  Limit foods that are high in fat and processed sugars, such as fried and sweet foods. Activity  Exercise only as directed by your health care provider. Experiencing uterine cramps is a good sign to stop exercising.  Avoid heavy lifting, wear low heel shoes, and practice good posture.  Wear your seat belt at all times when driving.  Rest with your legs elevated if you have leg cramps or low back pain.  Wear a good support bra for breast tenderness.  Do not use hot tubs, steam rooms, or saunas. Lifestyle  Avoid all smoking, herbs, alcohol, and unprescribed drugs. These chemicals affect the formation and growth of the baby.  Do not use any products that contain nicotine or tobacco, such as cigarettes and e-cigarettes. If you need help quitting, ask your health care provider.  A sexual relationship may be continued unless your health care provider directs you otherwise. General instructions  Follow your health care provider's instructions regarding medicine use. There are medicines that are either safe or unsafe to take during pregnancy.  Take warm sitz baths to soothe any pain or discomfort caused by hemorrhoids. Use hemorrhoid cream if your health care provider approves.  If you develop varicose veins, wear support hose. Elevate your feet for 15 minutes, 3-4 times a day. Limit salt in your diet.  Visit your dentist if you have not gone yet during your pregnancy. Use a soft toothbrush to brush your teeth and be gentle when you floss.  Keep all follow-up  prenatal visits as told by your health care provider. This is important. Contact a health care provider if:  You have dizziness.  You have mild pelvic cramps, pelvic pressure, or nagging pain in the abdominal area.  You have persistent nausea, vomiting, or diarrhea.  You have a bad smelling vaginal discharge.  You have pain with urination. Get help right away if:  You have a fever.  You are leaking fluid from your vagina.  You have spotting or bleeding from your vagina.  You have severe abdominal cramping or pain.  You have rapid weight gain or weight loss.  You have shortness of breath with chest pain.  You notice sudden or extreme swelling of your face, hands, ankles, feet, or legs.  You have not felt your baby move in over an hour.  You have severe headaches that do not go away with medicine.  You have vision changes. Summary  The second trimester is from week 13 through week 28 (months 4 through 6). It is also a time when the fetus is growing rapidly.  Your body goes   through many changes during pregnancy. The changes vary from woman to woman.  Avoid all smoking, herbs, alcohol, and unprescribed drugs. These chemicals affect the formation and growth your baby.  Do not use any tobacco products, such as cigarettes, chewing tobacco, and e-cigarettes. If you need help quitting, ask your health care provider.  Contact your health care provider if you have any questions. Keep all prenatal visits as told by your health care provider. This is important. This information is not intended to replace advice given to you by your health care provider. Make sure you discuss any questions you have with your health care provider. Document Released: 09/16/2001 Document Revised: 02/28/2016 Document Reviewed: 11/23/2012 Elsevier Interactive Patient Education  2017 [employer]  

## 2016-10-01 NOTE — Progress Notes (Signed)
   PRENATAL VISIT NOTE  Subjective:  Kristine Cordova is a 25 y.o. G2P1001 at 4076w5d being seen today for ongoing prenatal care.  She is currently monitored for the following issues for this high-risk pregnancy and has Supervision of other normal pregnancy, antepartum and Rh negative state in antepartum period on her problem list.  Patient reports no complaints.  Contractions: Not present. Vag. Bleeding: None.  Movement: Present. Denies leaking of fluid.   The following portions of the patient's history were reviewed and updated as appropriate: allergies, current medications, past family history, past medical history, past social history, past surgical history and problem list. Problem list updated.  Objective:   Vitals:   10/01/16 1515  BP: 119/66  Pulse: 66  Weight: 238 lb (108 kg)    Fetal Status: Fetal Heart Rate (bpm): 146   Movement: Present     General:  Alert, oriented and cooperative. Patient is in no acute distress.  Skin: Skin is warm and dry. No rash noted.   Cardiovascular: Normal heart rate noted  Respiratory: Normal respiratory effort, no problems with respiration noted  Abdomen: Soft, gravid, appropriate for gestational age. Pain/Pressure: Absent     Pelvic:  Cervical exam deferred        Extremities: Normal range of motion.  Edema: None  Mental Status: Normal mood and affect. Normal behavior. Normal judgment and thought content.   Assessment and Plan:  Pregnancy: G2P1001 at 876w5d  1. Supervision of other normal pregnancy, antepartum  AFP, Quad Screen  2. Rh negative state in antepartum period  Rhogam at 28 weeks Pt did receive Rhogam in early preg due to bleeding.   3. Obesity affecting antepartum state  Spanish interpreter used for visit.     Preterm labor symptoms and general obstetric precautions including but not limited to vaginal bleeding, contractions, leaking of fluid and fetal movement were reviewed in detail with the patient. Please refer to After  Visit Summary for other counseling recommendations.  Return in about 4 weeks (around 10/29/2016).   Willodean Rosenthalarolyn Harraway-Smith, MD

## 2016-10-02 LAB — AFP, QUAD SCREEN
AFP: 32.1 ng/mL
Age Alone: 1:908 {titer}
CURR GEST AGE: 19.7 wk
Down Syndrome Scr Risk Est: 1:13300 {titer}
HCG, Total: 6.79 IU/mL
INH: 116.2 pg/mL
Interpretation-AFP: NEGATIVE
MOM FOR INH: 1.03
MoM for AFP: 0.81
MoM for hCG: 0.47
Open Spina bifida: NEGATIVE
Osb Risk: 1:26400 {titer}
TRI 18 SCR RISK EST: NEGATIVE
UE3 VALUE: 1.81 ng/mL
uE3 Mom: 1.14

## 2016-10-06 NOTE — L&D Delivery Note (Signed)
Delivery Note Pt progressed steadily through labor.  She had another decel X 5 minutes (was on 831mu/min of pitocin), which resolved. She was noted to be C/C/-1 station. She pushed very well, a 10 minute 2nd stage. At 12:07 AM a viable female was delivered via  (Presentation: ROA  ). The shoulders somewhat slow forthcoming, so the posterior (right) axilla was grasped with my index finger, and the baby was rotated clockwise into the oblique diameter.  At this point, the (now) anterior shoulder was released, and the baby delivered.  At no time was any traction placed on the baby's head.   APGAR: 8/9; weight pending  .  After 1 minute, the cord was clamped and cut. 40 units of pitocin diluted in 1000cc LR was infused rapidly IV.  The placenta separated spontaneously and delivered via CCT and maternal pushing effort.  It was inspected and appears to be intact with a 3 VC.  Anesthesia: epidural  Episiotomy:  none Lacerations:  none Suture Repair:  Est. Blood Loss (mL):  100 Mom to postpartum.  Baby to Couplet care / Skin to Skin.  CRESENZO-DISHMAN,Yudith Norlander 02/07/2017, 12:23 AM

## 2016-10-18 ENCOUNTER — Emergency Department (HOSPITAL_BASED_OUTPATIENT_CLINIC_OR_DEPARTMENT_OTHER)
Admission: EM | Admit: 2016-10-18 | Discharge: 2016-10-18 | Disposition: A | Discharge status: Home / Self Care | Payer: Medicaid Other | Attending: Emergency Medicine | Admitting: Emergency Medicine

## 2016-10-18 ENCOUNTER — Encounter (HOSPITAL_BASED_OUTPATIENT_CLINIC_OR_DEPARTMENT_OTHER): Payer: Self-pay | Admitting: Emergency Medicine

## 2016-10-18 DIAGNOSIS — I10 Essential (primary) hypertension: Secondary | ICD-10-CM | POA: Insufficient documentation

## 2016-10-18 DIAGNOSIS — J069 Acute upper respiratory infection, unspecified: Secondary | ICD-10-CM | POA: Diagnosis not present

## 2016-10-18 DIAGNOSIS — R112 Nausea with vomiting, unspecified: Secondary | ICD-10-CM | POA: Diagnosis not present

## 2016-10-18 DIAGNOSIS — R05 Cough: Secondary | ICD-10-CM | POA: Insufficient documentation

## 2016-10-18 DIAGNOSIS — Z87891 Personal history of nicotine dependence: Secondary | ICD-10-CM | POA: Diagnosis not present

## 2016-10-18 DIAGNOSIS — B9789 Other viral agents as the cause of diseases classified elsewhere: Secondary | ICD-10-CM

## 2016-10-18 MED ORDER — CETIRIZINE HCL 10 MG PO TABS: 10.0000 mg | ORAL_TABLET | Freq: Every day | ORAL | 1 refills | Status: DC

## 2016-10-18 MED ORDER — VITAMIN B-6 25 MG PO TABS: 25.0000 mg | ORAL_TABLET | Freq: Every day | ORAL | 0 refills | Status: DC

## 2016-10-18 NOTE — ED Triage Notes (Signed)
Pt c/o cough, congestion since last week; felt hot last night, but dd not measure temp

## 2016-10-18 NOTE — ED Provider Notes (Signed)
[facility]   CSN: [id number] Arrival date & time: 10/18/16  0913     History   Chief Complaint Chief Complaint  Patient presents with  . Cough    HPI Kristine Cordova is a 26 y.o. female.  G2P1 Patient presents to the emergency department with chief complaint of cough and congestion 1 week.  She also reports ear fullness, and sore throat, and rhinorrhea. She states that last night she had one episode of vomiting. She denies any abdominal pain, vaginal discharge, bleeding, or dysuria. She has tried taking Tylenol for her symptoms. There are no other associated symptoms. There are no modifying factors.   The history is provided by the patient. No language interpreter was used.    Past Medical History:  Diagnosis Date  . Diverticulitis   . Hypertension     Patient Active Problem List   Diagnosis Date Noted  . Obesity affecting pregnancy 10/01/2016  . Rh negative state in antepartum period 08/08/2016  . Supervision of other normal pregnancy, antepartum 08/06/2016    Past Surgical History:  Procedure Laterality Date  . CHOLECYSTECTOMY    . OOPHORECTOMY      OB History    Gravida Para Term Preterm AB Living   2 1 1     1    SAB TAB Ectopic Multiple Live Births                   Home Medications    Prior to Admission medications   Medication Sig Start Date End Date Taking? Authorizing Provider  acetaminophen (TYLENOL) 325 MG tablet Take 650 mg by mouth every 6 (six) hours as needed.    Historical Provider, MD  Prenatal Vit-Fe Fumarate-FA (PRENATAL VITAMIN PO) Take by mouth.    Historical Provider, MD    Family History Family History  Problem Relation Age of Onset  . Cancer Maternal Grandfather   . Diabetes Paternal Grandmother     Social History Social History  Substance Use Topics  . Smoking status: Former Smoker    Packs/day: 0.25    Years: 2.00    Types: Cigarettes  . Smokeless tobacco: Never Used  . Alcohol use No      Allergies   Patient has no known allergies.   Review of Systems Review of Systems  Constitutional: Positive for chills and fever.  HENT: Positive for postnasal drip, rhinorrhea, sinus pressure, sneezing and sore throat.   Respiratory: Positive for cough. Negative for shortness of breath.   Cardiovascular: Negative for chest pain.  Gastrointestinal: Positive for nausea and vomiting. Negative for abdominal pain, constipation and diarrhea.  Genitourinary: Negative for dysuria.     Physical Exam Updated Vital Signs BP 112/65 (BP Location: Right Arm)   Pulse 86   Temp 98.2 F (36.8 C) (Oral)   Resp 18   Ht 5\' 4"  (1.626 m)   Wt 108 kg   LMP 05/08/2016 (Approximate)   SpO2 100%   BMI 40.85 kg/m   Physical Exam Physical Exam  Constitutional: Pt  is oriented to person, place, and time. Appears well-developed and well-nourished. No distress.  HENT:  Head: Normocephalic and atraumatic.  Right Ear: Tympanic membrane with mild congestion, no erythema, external ear and ear canal normal.  Left Ear: Tympanic membrane with mild congestion, no erythema, external ear and ear canal normal.  Nose: Mucosal edema and moderate rhinorrhea present. No epistaxis. Right sinus exhibits no maxillary sinus tenderness and no frontal sinus tenderness. Left sinus exhibits no  maxillary sinus tenderness and no frontal sinus tenderness.  Mouth/Throat: Uvula is midline and mucous membranes are normal. Mucous membranes are not pale and not cyanotic. No oropharyngeal exudate, posterior oropharyngeal edema, posterior oropharyngeal erythema or tonsillar abscesses.  Eyes: Conjunctivae are normal. Pupils are equal, round, and reactive to light.  Neck: Normal range of motion and full passive range of motion without pain.  Cardiovascular: Normal rate and intact distal pulses.   Pulmonary/Chest: Effort normal and breath sounds normal. No stridor.  Clear and equal breath sounds without focal wheezes, rhonchi,  rales  Abdominal: No focal abdominal tenderness, no RLQ tenderness or pain at McBurney's point, no RUQ tenderness or Murphy's sign, no left-sided abdominal tenderness, no fluid wave, or signs of peritonitis Musculoskeletal: Normal range of motion.  Lymphadenopathy:    Pthas no cervical adenopathy.  Neurological: Pt is alert and oriented to person, place, and time.  Skin: Skin is warm and dry. No rash noted. Pt is not diaphoretic.  Psychiatric: Normal mood and affect.  Nursing note and vitals reviewed.    ED Treatments / Results  Labs (all labs ordered are listed, but only abnormal results are displayed) Labs Reviewed - No data to display  EKG  EKG Interpretation None       Radiology No results found.  Procedures Procedures (including critical care time)  Medications Ordered in ED Medications - No data to display   Initial Impression / Assessment and Plan / ED Course  I have reviewed the triage vital signs and the nursing notes.  Pertinent labs & imaging results that were available during my care of the patient were reviewed by me and considered in my medical decision making (see chart for details).  Clinical Course     Patients symptoms are consistent with URI, likely viral etiology.  Doubt influenza given that symptoms started 1 week ago and she is not febrile, nor does she have body aches.  Doubt pneumonia, lung sounds are perfectly clear, no productive cough. Discussed that antibiotics are not indicated for viral infections. Pt will be discharged with symptomatic treatment.  Verbalizes understanding and is agreeable with plan. Pt is hemodynamically stable & in NAD prior to dc.   Final Clinical Impressions(s) / ED Diagnoses   Final diagnoses:  Viral URI with cough    New Prescriptions New Prescriptions   CETIRIZINE (ZYRTEC ALLERGY) 10 MG TABLET    Take 1 tablet (10 mg total) by mouth daily.   VITAMIN B-6 (PYRIDOXINE) 25 MG TABLET    Take 1 tablet (25 mg total)  by mouth daily.     Roxy Horseman, PA-C 10/18/16 1478    Charlynne Pander, MD 10/18/16 [phone]

## 2016-10-27 ENCOUNTER — Encounter: Payer: Medicaid Other | Admitting: Family Medicine

## 2016-10-27 ENCOUNTER — Encounter (HOSPITAL_COMMUNITY): Payer: Self-pay | Admitting: *Deleted

## 2016-10-27 ENCOUNTER — Inpatient Hospital Stay (HOSPITAL_COMMUNITY)
Admission: AD | Admit: 2016-10-27 | Discharge: 2016-10-27 | Disposition: A | Discharge status: Home / Self Care | Payer: Medicaid Other | Source: Ambulatory Visit | Attending: Obstetrics & Gynecology | Admitting: Obstetrics & Gynecology

## 2016-10-27 DIAGNOSIS — Z87891 Personal history of nicotine dependence: Secondary | ICD-10-CM | POA: Diagnosis not present

## 2016-10-27 DIAGNOSIS — O26892 Other specified pregnancy related conditions, second trimester: Secondary | ICD-10-CM | POA: Diagnosis present

## 2016-10-27 DIAGNOSIS — R109 Unspecified abdominal pain: Secondary | ICD-10-CM | POA: Insufficient documentation

## 2016-10-27 DIAGNOSIS — W000XXA Fall on same level due to ice and snow, initial encounter: Secondary | ICD-10-CM

## 2016-10-27 DIAGNOSIS — Z79899 Other long term (current) drug therapy: Secondary | ICD-10-CM | POA: Diagnosis not present

## 2016-10-27 DIAGNOSIS — O162 Unspecified maternal hypertension, second trimester: Secondary | ICD-10-CM | POA: Diagnosis not present

## 2016-10-27 DIAGNOSIS — Z3A23 23 weeks gestation of pregnancy: Secondary | ICD-10-CM | POA: Diagnosis not present

## 2016-10-27 DIAGNOSIS — O9A212 Injury, poisoning and certain other consequences of external causes complicating pregnancy, second trimester: Secondary | ICD-10-CM | POA: Diagnosis not present

## 2016-10-27 DIAGNOSIS — Z809 Family history of malignant neoplasm, unspecified: Secondary | ICD-10-CM | POA: Diagnosis not present

## 2016-10-27 DIAGNOSIS — Z9049 Acquired absence of other specified parts of digestive tract: Secondary | ICD-10-CM | POA: Insufficient documentation

## 2016-10-27 DIAGNOSIS — M549 Dorsalgia, unspecified: Secondary | ICD-10-CM | POA: Insufficient documentation

## 2016-10-27 DIAGNOSIS — O99212 Obesity complicating pregnancy, second trimester: Secondary | ICD-10-CM

## 2016-10-27 DIAGNOSIS — S3992XA Unspecified injury of lower back, initial encounter: Secondary | ICD-10-CM | POA: Diagnosis not present

## 2016-10-27 DIAGNOSIS — Z9889 Other specified postprocedural states: Secondary | ICD-10-CM | POA: Insufficient documentation

## 2016-10-27 DIAGNOSIS — Z833 Family history of diabetes mellitus: Secondary | ICD-10-CM | POA: Diagnosis not present

## 2016-10-27 DIAGNOSIS — W009XXA Unspecified fall due to ice and snow, initial encounter: Secondary | ICD-10-CM | POA: Diagnosis not present

## 2016-10-27 LAB — URINALYSIS, ROUTINE W REFLEX MICROSCOPIC
Bilirubin Urine: NEGATIVE
GLUCOSE, UA: 50 mg/dL — AB
Hgb urine dipstick: NEGATIVE
Ketones, ur: 5 mg/dL — AB
NITRITE: NEGATIVE
PH: 6 (ref 5.0–8.0)
Protein, ur: 30 mg/dL — AB
Specific Gravity, Urine: 1.024 (ref 1.005–1.030)

## 2016-10-27 NOTE — MAU Provider Note (Signed)
Chief Complaint:  Fall; Abdominal Pain; and Back Pain   First Provider Initiated Contact with Patient 10/27/16 1425      HPI: Kristine Cordova is a 26 y.o. G2P1001 at [redacted]w[redacted]d who presents to maternity admissions reporting she slipped outside her house today while leaving to go to her prenatal visit. She did not realize there was a patch of ice on the ground and slipped before getting into her car. She fell onto her back and buttocks but scraped her head on some stairs outside her house. She denies hitting her head or abdomen. She reports constant soreness pain in her buttocks and left lower back. She reported some intermittent cramps/contractions when arriving in MAU but reports these have resolved without treatment. She has not tried any treatments, walking makes her back/buttocks pain worse, nothing makes her symptoms better.  She has no associated symptoms. She reports good fetal movement, denies LOF, vaginal bleeding, vaginal itching/burning, urinary symptoms, h/a, dizziness, n/v, or fever/chills.    HPI  Past Medical History: Past Medical History:  Diagnosis Date  . Diverticulitis   . Hypertension     Past obstetric history: OB History  Gravida Para Term Preterm AB Living  2 1 1     1   SAB TAB Ectopic Multiple Live Births               # Outcome Date GA Lbr Len/2nd Weight Sex Delivery Anes PTL Lv  2 Current           1 Term    8 lb 9 oz (3.884 kg)  Vag-Spont         Past Surgical History: Past Surgical History:  Procedure Laterality Date  . CHOLECYSTECTOMY    . OOPHORECTOMY      Family History: Family History  Problem Relation Age of Onset  . Cancer Maternal Grandfather   . Diabetes Paternal Grandmother     Social History: Social History  Substance Use Topics  . Smoking status: Former Smoker    Packs/day: 0.25    Years: 2.00    Types: Cigarettes  . Smokeless tobacco: Former Neurosurgeon  . Alcohol use No    Allergies: No Known Allergies  Meds:  Prescriptions Prior  to Admission  Medication Sig Dispense Refill Last Dose  . Prenatal Vit-Fe Fumarate-FA (PRENATAL VITAMIN PO) Take 1 tablet by mouth daily.    10/27/2016 at Unknown time  . acetaminophen (TYLENOL) 325 MG tablet Take 650 mg by mouth every 6 (six) hours as needed for mild pain or headache.    prn  . cetirizine (ZYRTEC ALLERGY) 10 MG tablet Take 1 tablet (10 mg total) by mouth daily. (Patient not taking: Reported on 10/27/2016) 30 tablet 1 Not Taking at Unknown time  . vitamin B-6 (PYRIDOXINE) 25 MG tablet Take 1 tablet (25 mg total) by mouth daily. (Patient not taking: Reported on 10/27/2016) 10 tablet 0 Not Taking at Unknown time    ROS:  Review of Systems  Constitutional: Negative for chills, fatigue and fever.  Respiratory: Negative for shortness of breath.   Cardiovascular: Negative for chest pain.  Gastrointestinal: Negative for abdominal pain.  Genitourinary: Negative for difficulty urinating, dysuria, flank pain, pelvic pain, vaginal bleeding, vaginal discharge and vaginal pain.  Musculoskeletal: Positive for back pain and myalgias.  Neurological: Negative for dizziness and headaches.  Psychiatric/Behavioral: Negative.      I have reviewed patient's Past Medical Hx, Surgical Hx, Family Hx, Social Hx, medications and allergies.   Physical Exam   Patient  Vitals for the past 24 hrs:  BP Temp Temp src Pulse Resp  10/27/16 1201 137/71 98 F (36.7 C) Oral 89 16   Constitutional: Well-developed, well-nourished female in no acute distress.  Cardiovascular: normal rate Respiratory: normal effort GI: Abd soft, non-tender, gravid appropriate for gestational age.  MS: Extremities nontender, no edema, normal ROM Neurologic: Alert and oriented x 4.  GU: Neg CVAT.  PELVIC EXAM: Deferred     FHT:  Baseline 145 , moderate variability, accelerations present, no decelerations Contractions: None on toco or to palpation   Labs: Results for orders placed or performed during the hospital  encounter of 10/27/16 (from the past 24 hour(s))  Urinalysis, Routine w reflex microscopic     Status: Abnormal   Collection Time: 10/27/16 12:08 PM  Result Value Ref Range   Color, Urine AMBER (A) YELLOW   APPearance CLOUDY (A) CLEAR   Specific Gravity, Urine 1.024 1.005 - 1.030   pH 6.0 5.0 - 8.0   Glucose, UA 50 (A) NEGATIVE mg/dL   Hgb urine dipstick NEGATIVE NEGATIVE   Bilirubin Urine NEGATIVE NEGATIVE   Ketones, ur 5 (A) NEGATIVE mg/dL   Protein, ur 30 (A) NEGATIVE mg/dL   Nitrite NEGATIVE NEGATIVE   Leukocytes, UA SMALL (A) NEGATIVE   RBC / HPF 0-5 0 - 5 RBC/hpf   WBC, UA 0-5 0 - 5 WBC/hpf   Bacteria, UA RARE (A) NONE SEEN   Squamous Epithelial / LPF TOO NUMEROUS TO COUNT (A) NONE SEEN   Mucous PRESENT    O/NEG/-- (11/01 1615)  Imaging:  No results found.  MAU Course/MDM: I have ordered labs and reviewed results.  NST reviewed and reactive x 4 hours of monitoring Treatments in MAU included PO hydration.   Pt with no cramping/contractions, pain only in areas where she impacted ground when she fell.  Will treat for musculoskeletal pain. Rest/ice/heat/warm bath/Tylenol for pain. Pt stable at time of discharge.   Assessment: 1. Traumatic injury during pregnancy in second trimester   2. Obesity affecting pregnancy in second trimester   3. Fall due to ice or snow, initial encounter     Plan: Discharge home  Preterm labor precautions and fetal kick counts Follow-up Information    [facility] [place] [facility].   Specialty:  Obstetrics and Gynecology Why:  As scheduled, return to MAU as needed for emergencies Contact information: [address] [place] [phone]         Allergies as of 10/27/2016   No Known Allergies     Medication List    STOP taking these medications   cetirizine 10 MG tablet Commonly known as:  ZYRTEC ALLERGY   vitamin B-6 25 MG tablet Commonly known  as:  pyridOXINE     TAKE these medications   acetaminophen 325 MG tablet Commonly known as:  TYLENOL Take 650 mg by mouth every 6 (six) hours as needed for mild pain or headache.   PRENATAL VITAMIN PO Take 1 tablet by mouth daily.       Sharen CounterLisa Leftwich-Kirby Certified Nurse-Midwife 10/27/2016 3:18 PM

## 2016-10-27 NOTE — Discharge Instructions (Signed)
What Do I Need to Know About Injuries During Pregnancy? °Trauma is the most common cause of injury and death in pregnant women. This can also result in significant harm or death of the baby. °Your baby is protected in the womb (uterus) by a sac filled with fluid (amniotic sac). Your baby can be harmed if there is direct, high-impact trauma to your abdomen and pelvis. This type of trauma can result in tearing of your uterus, the placenta pulling away from the wall of the uterus (placenta abruption), or the amniotic sac breaking open (rupture of membranes). These injuries can decrease or stop the blood supply to your baby or cause you to go into labor earlier than expected. Minor falls and low-impact automobile accidents do not usually harm your baby, even if they do minimally harm you. °WHAT KIND OF INJURIES CAN AFFECT MY PREGNANCY? °The most common causes of injury or death to a baby include: °· Falls. Falls are more common in the second and third trimester of the pregnancy. Factors that increase your risk of falling include: °¨ Increase in your weight. °¨ The change in your center of gravity. °¨ Tripping over an object that cannot be seen. °¨ Increased looseness (laxity) of your ligaments resulting in less coordinated movements (you may feel clumsy). °¨ Falling during high-risk activities like horseback riding or skiing. °· Automobile accidents. It is important to wear your seat belt properly, with the lap belt below your abdomen, and always practice safe driving. °· Domestic violence or assault. °· Burns (fire or electrical). °The most common causes of injury or death to the pregnant woman include: °· Injuries that cause severe bleeding, shock, and loss of blood flow to major organs. °· Head and neck injuries that result in severe brain or spinal damage. °· Chest trauma that can cause direct injury to the heart and lungs or any injury that affects the area enclosed by the ribs. Trauma to this area can result in  cardiorespiratory arrest. °WHAT CAN I DO TO PROTECT MYSELF AND MY BABY FROM INJURY WHILE I AM PREGNANT? °· Remove slippery rugs and loose objects on the floor that increase your risk of tripping. °· Avoid walking on wet or slippery floors. °· Wear comfortable shoes that have a good grip on the sole. Do not wear high-heeled shoes. °· Always wear your seat belt properly, with the lap belt below your abdomen, and always practice safe driving. Do not ride on a motorcycle while pregnant. °· Do not participate in high-impact activities or sports. °· Avoid fires, starting fires, lifting heavy pots of boiling or hot liquids, and fixing electrical problems. °· Only take over-the-counter or prescription medicines for pain, fever, or discomfort as directed by your health care provider. °· Know your blood type and the father's blood type in case you develop vaginal bleeding or experience an injury for which a blood transfusion may be necessary. °· Call your local emergency services (911 in the [place] if you are a victim of domestic violence or assault. Spousal abuse can be a significant cause of trauma during pregnancy. For help and support, contact the National Domestic Violence Hotline. °WHEN SHOULD I SEEK IMMEDIATE MEDICAL CARE?  °· You fall on your abdomen or experience any high-force accident or injury. °· You have been assaulted (domestic or otherwise). °· You have been in a car accident. °· You develop vaginal bleeding. °· You develop fluid leaking from the vagina. °· You develop uterine contractions (pelvic cramping, pain, or significant low back   pain). °· You become weak or faint, or have uncontrolled vomiting after trauma. °· You had a serious burn. This includes burns to the face, neck, hands, or genitals, or burns greater than the size of your palm anywhere else. °· You develop neck stiffness or pain after a fall or from other trauma. °· You develop a headache or vision problems after a fall or from other  trauma. °· You do not feel the baby moving or the baby is not moving as much as before a fall or other trauma. °This information is not intended to replace advice given to you by your health care provider. Make sure you discuss any questions you have with your health care provider. °Document Released: 10/30/2004 Document Revised: 10/13/2014 Document Reviewed: 06/29/2013 °Elsevier Interactive Patient Education © 2017 [employer] ° °

## 2016-10-27 NOTE — MAU Note (Signed)
Pt states she fell on the way to her car this morning, had clinic appt.  Fell back & fell on L back & buttocks.  Has pain in L lower back & feels occasional hardening in mid abdomen - new since fall.  Denies bleeding or LOF.  Also has abrasion on L forehead.

## 2016-11-05 ENCOUNTER — Ambulatory Visit (INDEPENDENT_AMBULATORY_CARE_PROVIDER_SITE_OTHER): Payer: Medicaid Other | Admitting: Family Medicine

## 2016-11-05 VITALS — BP 116/57 | HR 69 | Wt 241.0 lb

## 2016-11-05 DIAGNOSIS — M9905 Segmental and somatic dysfunction of pelvic region: Secondary | ICD-10-CM

## 2016-11-05 DIAGNOSIS — O9989 Other specified diseases and conditions complicating pregnancy, childbirth and the puerperium: Secondary | ICD-10-CM

## 2016-11-05 DIAGNOSIS — M9904 Segmental and somatic dysfunction of sacral region: Secondary | ICD-10-CM | POA: Diagnosis not present

## 2016-11-05 DIAGNOSIS — O99891 Other specified diseases and conditions complicating pregnancy: Secondary | ICD-10-CM

## 2016-11-05 DIAGNOSIS — O26892 Other specified pregnancy related conditions, second trimester: Secondary | ICD-10-CM | POA: Diagnosis not present

## 2016-11-05 DIAGNOSIS — M549 Dorsalgia, unspecified: Secondary | ICD-10-CM

## 2016-11-05 DIAGNOSIS — M9903 Segmental and somatic dysfunction of lumbar region: Secondary | ICD-10-CM

## 2016-11-05 DIAGNOSIS — Z3482 Encounter for supervision of other normal pregnancy, second trimester: Secondary | ICD-10-CM

## 2016-11-05 DIAGNOSIS — Z3480 Encounter for supervision of other normal pregnancy, unspecified trimester: Secondary | ICD-10-CM

## 2016-11-05 NOTE — Progress Notes (Signed)
   PRENATAL VISIT NOTE  Subjective:  Kristine Cordova is a 26 y.o. G2P1001 at 10752w5d being seen today for ongoing prenatal care.  She is currently monitored for the following issues for this low-risk pregnancy and has Supervision of other normal pregnancy, antepartum; Rh negative state in antepartum period; and Obesity affecting pregnancy on her problem list.  Patient reports backpain after fall, mostly on left, radiates down leg to knee.  Contractions: Not present. Vag. Bleeding: None.  Movement: Present. Denies leaking of fluid.   The following portions of the patient's history were reviewed and updated as appropriate: allergies, current medications, past family history, past medical history, past social history, past surgical history and problem list. Problem list updated.  Objective:   Vitals:   11/05/16 1456  BP: (!) 116/57  Pulse: 69  Weight: 241 lb (109.3 kg)    Fetal Status:     Movement: Present     General:  Alert, oriented and cooperative. Patient is in no acute distress.  Skin: Skin is warm and dry. No rash noted.   Cardiovascular: Normal heart rate noted  Respiratory: Normal respiratory effort, no problems with respiration noted  Abdomen: Soft, gravid, appropriate for gestational age.       Pelvic:  Cervical exam deferred        MSK: Restriction, tenderness, tissue texture changes, and paraspinal spasm in the lumbar spine  Neuro: Moves all four extremities with no focal neurological deficit  Extremities: Normal range of motion.  Edema: None  Mental Status: Normal mood and affect. Normal behavior. Normal judgment and thought content.   OSE: Head   Cervical   Thoracic   Rib   Lumbar L5 ESRR  Sacrum L/L   Pelvis Right ant innom    Assessment and Plan:  Pregnancy: G2P1001 at 3952w5d  1. Supervision of other normal pregnancy, antepartum FH and FHT normal. 28 week labs at next visit.  2. Back pain in pregnancy  3. Somatic dysfunction of lumbar region  4. Somatic  dysfunction of sacral region  5. Somatic dysfunction of pelvis region Received verbal permission to preform OMT.  Pt treated with HVLA with good subjective relief and objective improvement of motion.  Preterm labor symptoms and general obstetric precautions including but not limited to vaginal bleeding, contractions, leaking of fluid and fetal movement were reviewed in detail with the patient. Please refer to After Visit Summary for other counseling recommendations.  No Follow-up on file.  Levie HeritageJacob J Araceli Coufal, DO

## 2016-12-01 ENCOUNTER — Other Ambulatory Visit: Payer: Self-pay | Admitting: *Deleted

## 2016-12-01 ENCOUNTER — Ambulatory Visit (INDEPENDENT_AMBULATORY_CARE_PROVIDER_SITE_OTHER): Payer: Medicaid Other | Admitting: Family Medicine

## 2016-12-01 ENCOUNTER — Other Ambulatory Visit: Payer: Self-pay | Admitting: Family Medicine

## 2016-12-01 VITALS — BP 111/60 | HR 68 | Wt 243.0 lb

## 2016-12-01 DIAGNOSIS — O26899 Other specified pregnancy related conditions, unspecified trimester: Secondary | ICD-10-CM

## 2016-12-01 DIAGNOSIS — O360930 Maternal care for other rhesus isoimmunization, third trimester, not applicable or unspecified: Secondary | ICD-10-CM | POA: Diagnosis not present

## 2016-12-01 DIAGNOSIS — R12 Heartburn: Secondary | ICD-10-CM

## 2016-12-01 DIAGNOSIS — Z3480 Encounter for supervision of other normal pregnancy, unspecified trimester: Secondary | ICD-10-CM

## 2016-12-01 DIAGNOSIS — Z23 Encounter for immunization: Secondary | ICD-10-CM | POA: Diagnosis not present

## 2016-12-01 DIAGNOSIS — Z3490 Encounter for supervision of normal pregnancy, unspecified, unspecified trimester: Secondary | ICD-10-CM

## 2016-12-01 DIAGNOSIS — O26893 Other specified pregnancy related conditions, third trimester: Secondary | ICD-10-CM

## 2016-12-01 DIAGNOSIS — Z6791 Unspecified blood type, Rh negative: Secondary | ICD-10-CM

## 2016-12-01 DIAGNOSIS — R109 Unspecified abdominal pain: Secondary | ICD-10-CM

## 2016-12-01 DIAGNOSIS — Z3483 Encounter for supervision of other normal pregnancy, third trimester: Secondary | ICD-10-CM

## 2016-12-01 MED ORDER — FAMOTIDINE 20 MG PO TABS: 20.0000 mg | ORAL_TABLET | Freq: Two times a day (BID) | ORAL | 3 refills | Status: DC

## 2016-12-01 MED ORDER — RHO D IMMUNE GLOBULIN 1500 UNIT/2ML IJ SOSY
300.0000 ug | PREFILLED_SYRINGE | Freq: Once | INTRAMUSCULAR | Status: AC
  Administered 2016-12-01: 300 ug via INTRAMUSCULAR

## 2016-12-01 NOTE — Progress Notes (Signed)
   PRENATAL VISIT NOTE  Subjective:  Kristine SlaterJessica Cordova is a 26 y.o. G2P1001 at 7156w3d being seen today for ongoing prenatal care.  She is currently monitored for the following issues for this low-risk pregnancy and has Supervision of other normal pregnancy, antepartum; Rh negative state in antepartum period; and Obesity affecting pregnancy on her problem list.  Patient reports heartburn. Also has epigastric pain similar to, but not as severe as when she had diverticulitis. Some diarrhea, but no blood in stool. Pain started yesterday and fairly constant. Contractions: Not present. Vag. Bleeding: None.  Movement: Present. Denies leaking of fluid.   The following portions of the patient's history were reviewed and updated as appropriate: allergies, current medications, past family history, past medical history, past social history, past surgical history and problem list. Problem list updated.  Objective:   Vitals:   12/01/16 0812  BP: 111/60  Pulse: 68  Weight: 243 lb (110.2 kg)    Fetal Status: Fetal Heart Rate (bpm): 154   Movement: Present     General:  Alert, oriented and cooperative. Patient is in no acute distress.  Skin: Skin is warm and dry. No rash noted.   Cardiovascular: Normal heart rate noted  Respiratory: Normal respiratory effort, no problems with respiration noted  Abdomen: Soft, gravid, appropriate for gestational age. Pain/Pressure: Present     Pelvic:  Cervical exam deferred        Extremities: Normal range of motion.  Edema: None  Mental Status: Normal mood and affect. Normal behavior. Normal judgment and thought content.   Assessment and Plan:  Pregnancy: G2P1001 at 6256w3d  1. Supervision of other normal pregnancy, antepartum FHT normal - CBC  2. Rh negative state in antepartum period Rhogam after bloodwork  3. Prenatal care, antepartum - Glucose Tolerance, 2 Hours w/1 Hour - RPR - HIV antibody (with reflex) - Antibody screen; Future  4. Heartburn during  pregnancy in third trimester Pepcid prescribed.  5. Abdominal pain in pregnancy, third trimester Will check CBC. Recommended bland diet. If worsens, advised pt to go to ED for workup.  Preterm labor symptoms and general obstetric precautions including but not limited to vaginal bleeding, contractions, leaking of fluid and fetal movement were reviewed in detail with the patient. Please refer to After Visit Summary for other counseling recommendations.  No Follow-up on file.   Levie HeritageJacob J Aceyn Kathol, DO

## 2016-12-01 NOTE — Addendum Note (Signed)
Addended by: Anell BarrHOWARD, JENNIFER L on: 12/01/2016 11:34 AM   Modules accepted: Orders

## 2016-12-02 LAB — RPR: RPR: NONREACTIVE

## 2016-12-02 LAB — GLUCOSE TOLERANCE, 2 HOURS W/ 1HR
GLUCOSE, 1 HOUR: 161 mg/dL (ref 65–179)
GLUCOSE, 2 HOUR: 132 mg/dL (ref 65–152)
Glucose, Fasting: 89 mg/dL (ref 65–91)

## 2016-12-02 LAB — CBC
Hematocrit: 34.3 % (ref 34.0–46.6)
Hemoglobin: 11.4 g/dL (ref 11.1–15.9)
MCH: 28.4 pg (ref 26.6–33.0)
MCHC: 33.2 g/dL (ref 31.5–35.7)
MCV: 85 fL (ref 79–97)
Platelets: 249 10*3/uL (ref 150–379)
RBC: 4.02 x10E6/uL (ref 3.77–5.28)
RDW: 13.5 % (ref 12.3–15.4)
WBC: 6.8 10*3/uL (ref 3.4–10.8)

## 2016-12-02 LAB — ANTIBODY SCREEN: ANTIBODY SCREEN: NEGATIVE

## 2016-12-02 LAB — HIV ANTIBODY (ROUTINE TESTING W REFLEX): HIV SCREEN 4TH GENERATION: NONREACTIVE

## 2016-12-15 ENCOUNTER — Ambulatory Visit (INDEPENDENT_AMBULATORY_CARE_PROVIDER_SITE_OTHER): Payer: Medicaid Other | Admitting: Family Medicine

## 2016-12-15 ENCOUNTER — Encounter: Payer: Self-pay | Admitting: Family Medicine

## 2016-12-15 VITALS — BP 121/62 | HR 72 | Wt 245.0 lb

## 2016-12-15 DIAGNOSIS — Z3483 Encounter for supervision of other normal pregnancy, third trimester: Secondary | ICD-10-CM

## 2016-12-15 DIAGNOSIS — Z6791 Unspecified blood type, Rh negative: Secondary | ICD-10-CM

## 2016-12-15 DIAGNOSIS — B3731 Acute candidiasis of vulva and vagina: Secondary | ICD-10-CM

## 2016-12-15 DIAGNOSIS — B373 Candidiasis of vulva and vagina: Secondary | ICD-10-CM

## 2016-12-15 DIAGNOSIS — O98813 Other maternal infectious and parasitic diseases complicating pregnancy, third trimester: Secondary | ICD-10-CM

## 2016-12-15 DIAGNOSIS — O26899 Other specified pregnancy related conditions, unspecified trimester: Secondary | ICD-10-CM

## 2016-12-15 DIAGNOSIS — Z3480 Encounter for supervision of other normal pregnancy, unspecified trimester: Secondary | ICD-10-CM

## 2016-12-15 MED ORDER — TERCONAZOLE 0.8 % VA CREA: 1.0000 | TOPICAL_CREAM | Freq: Every day | VAGINAL | 0 refills | Status: DC

## 2016-12-15 NOTE — Progress Notes (Signed)
   PRENATAL VISIT NOTE  Subjective:  Kristine Cordova is a 26 y.o. G2P1001 at 2664w3d being seen today for ongoing prenatal care.  She is currently monitored for the following issues for this low-risk pregnancy and has Supervision of other normal pregnancy, antepartum; Rh negative state in antepartum period; Maternal morbid obesity, antepartum (HCC); Chronic GERD; Group B streptococcus urinary tract infection affecting pregnancy; Prehypertension; and Irregular periods/menstrual cycles on her problem list.  Patient reports vaginal irritation.  Contractions: Not present. Vag. Bleeding: None.  Movement: Present. Denies leaking of fluid.   The following portions of the patient's history were reviewed and updated as appropriate: allergies, current medications, past family history, past medical history, past social history, past surgical history and problem list. Problem list updated.  Objective:   Vitals:   12/15/16 0945  BP: 121/62  Pulse: 72  Weight: 245 lb (111.1 kg)    Fetal Status: Fetal Heart Rate (bpm): 132 Fundal Height: 33 cm Movement: Present     General:  Alert, oriented and cooperative. Patient is in no acute distress.  Skin: Skin is warm and dry. No rash noted.   Cardiovascular: Normal heart rate noted  Respiratory: Normal respiratory effort, no problems with respiration noted  Abdomen: Soft, gravid, appropriate for gestational age. Pain/Pressure: Present     Pelvic:  Cervical exam deferred        Extremities: Normal range of motion.  Edema: None  Mental Status: Normal mood and affect. Normal behavior. Normal judgment and thought content.   Assessment and Plan:  Pregnancy: G2P1001 at 5264w3d  1. Supervision of other normal pregnancy, antepartum Continue routine prenatal care. Avoid carbohydrates  2. Rh negative state in antepartum period S/p Rhogam  3. Vagina, candidiasis Treat with terconazole. - terconazole (TERAZOL 3) 0.8 % vaginal cream; Place 1 applicator vaginally at  bedtime.  Dispense: 20 g; Refill: 0   Preterm labor symptoms and general obstetric precautions including but not limited to vaginal bleeding, contractions, leaking of fluid and fetal movement were reviewed in detail with the patient. Please refer to After Visit Summary for other counseling recommendations.  Return in 2 weeks (on 12/29/2016).   Reva Boresanya S Dairon Procter, MD

## 2016-12-15 NOTE — Patient Instructions (Addendum)
 Lactancia materna (Breastfeeding) Decidir amamantar es una de las mejores elecciones que puede hacer por usted y su beb. El cambio hormonal durante el embarazo produce el desarrollo del tejido mamario y aumenta la cantidad y el tamao de los conductos galactforos. Estas hormonas tambin permiten que las protenas, los azcares y las grasas de la sangre produzcan la leche materna en las glndulas productoras de leche. Las hormonas impiden que la leche materna sea liberada antes del nacimiento del beb, adems de impulsar el flujo de leche luego del nacimiento. Una vez que ha comenzado a amamantar, pensar en el beb, as como la succin o el llanto, pueden estimular la liberacin de leche de las glndulas productoras de leche. LOS BENEFICIOS DE AMAMANTAR Para el beb  La primera leche (calostro) ayuda a mejorar el funcionamiento del sistema digestivo del beb.  La leche tiene anticuerpos que ayudan a prevenir las infecciones en el beb.  El beb tiene una menor incidencia de asma, alergias y del sndrome de muerte sbita del lactante.  Los nutrientes en la leche materna son mejores para el beb que la leche maternizada y estn preparados exclusivamente para cubrir las necesidades del beb.  La leche materna mejora el desarrollo cerebral del beb.  Es menos probable que el beb desarrolle otras enfermedades, como obesidad infantil, asma o diabetes mellitus de tipo 2. Para usted  La lactancia materna favorece el desarrollo de un vnculo muy especial entre la madre y el beb.  Es conveniente. La leche materna siempre est disponible a la temperatura correcta y es econmica.  La lactancia materna ayuda a quemar caloras y a perder el peso ganado durante el embarazo.  Favorece la contraccin del tero al tamao que tena antes del embarazo de manera ms rpida y disminuye el sangrado (loquios) despus del parto.  La lactancia materna contribuye a reducir el riesgo de desarrollar diabetes  mellitus de tipo 2, osteoporosis o cncer de mama o de ovario en el futuro. SIGNOS DE QUE EL BEB EST HAMBRIENTO Primeros signos de hambre  Aumenta su estado de alerta o actividad.  Se estira.  Mueve la cabeza de un lado a otro.  Mueve la cabeza y abre la boca cuando se le toca la mejilla o la comisura de la boca (reflejo de bsqueda).  Aumenta las vocalizaciones, tales como sonidos de succin, se relame los labios, emite arrullos, suspiros, o chirridos.  Mueve la mano hacia la boca.  Se chupa con ganas los dedos o las manos. Signos tardos de hambre  Est agitado.  Llora de manera intermitente. Signos de hambre extrema Los signos de hambre extrema requerirn que lo calme y lo consuele antes de que el beb pueda alimentarse adecuadamente. No espere a que se manifiesten los siguientes signos de hambre extrema para comenzar a amamantar:  Agitacin.  Llanto intenso y fuerte.  Gritos. INFORMACIN BSICA SOBRE LA LACTANCIA MATERNA Iniciacin de la lactancia materna  Encuentre un lugar cmodo para sentarse o acostarse, con un buen respaldo para el cuello y la espalda.  Coloque una almohada o una manta enrollada debajo del beb para acomodarlo a la altura de la mama (si est sentada). Las almohadas para amamantar se han diseado especialmente a fin de servir de apoyo para los brazos y el beb mientras amamanta.  Asegrese de que el abdomen del beb est frente al suyo.  Masajee suavemente la mama. Con las yemas de los dedos, masajee la pared del pecho hacia el pezn en un movimiento circular. Esto estimula el   flujo de leche. Es posible que deba continuar este movimiento mientras amamanta si la leche fluye lentamente.  Sostenga la mama con el pulgar por arriba del pezn y los otros 4 dedos por debajo de la mama. Asegrese de que los dedos se encuentren lejos del pezn y de la boca del beb.  Empuje suavemente los labios del beb con el pezn o con el dedo.  Cuando la boca del  beb se abra lo suficiente, acrquelo rpidamente a la mama e introduzca todo el pezn y la zona oscura que lo rodea (areola), tanto como sea posible, dentro de la boca del beb. ? Debe haber ms areola visible por arriba del labio superior del beb que por debajo del labio inferior. ? La lengua del beb debe estar entre la enca inferior y la mama.  Asegrese de que la boca del beb est en la posicin correcta alrededor del pezn (prendida). Los labios del beb deben crear un sello sobre la mama y estar doblados hacia afuera (invertidos).  Es comn que el beb succione durante 2 a 3 minutos para que comience el flujo de leche materna. Cmo debe prenderse Es muy importante que le ensee al beb cmo prenderse adecuadamente a la mama. Si el beb no se prende adecuadamente, puede causarle dolor en el pezn y reducir la produccin de leche materna, y hacer que el beb tenga un escaso aumento de peso. Adems, si el beb no se prende adecuadamente al pezn, puede tragar aire durante la alimentacin. Esto puede causarle molestias al beb. Hacer eructar al beb al cambiar de mama puede ayudarlo a liberar el aire. Sin embargo, ensearle al beb cmo prenderse a la mama adecuadamente es la mejor manera de evitar que se sienta molesto por tragar aire mientras se alimenta. Signos de que el beb se ha prendido adecuadamente al pezn:  Tironea o succiona de modo silencioso, sin causarle dolor.  Se escucha que traga cada 3 o 4 succiones.  Hay movimientos musculares por arriba y por delante de sus odos al succionar. Signos de que el beb no se ha prendido adecuadamente al pezn:  Hace ruidos de succin o de chasquido mientras se alimenta.  Siente dolor en el pezn. Si cree que el beb no se prendi correctamente, deslice el dedo en la comisura de la boca y colquelo entre las encas del beb para interrumpir la succin. Intente comenzar a amamantar nuevamente. Signos de lactancia materna exitosa Signos del  beb:  Disminuye gradualmente el nmero de succiones o cesa la succin por completo.  Se duerme.  Relaja el cuerpo.  Retiene una pequea cantidad de leche en la boca.  Se desprende solo del pecho. Signos que presenta usted:  Las mamas han aumentado la firmeza, el peso y el tamao 1 a 3 horas despus de amamantar.  Estn ms blandas inmediatamente despus de amamantar.  Un aumento del volumen de leche, y tambin un cambio en su consistencia y color se producen hacia el quinto da de lactancia materna.  Los pezones no duelen, ni estn agrietados ni sangran. Signos de que su beb recibe la cantidad de leche suficiente  Mojar por lo menos 1 o 2 paales durante las primeras 24 horas despus del nacimiento.  Mojar por lo menos 5 o 6 paales cada 24 horas durante la primera semana despus del nacimiento. La orina debe ser transparente o de color amarillo plido a los 5 das despus del nacimiento.  Mojar entre 6 y 8 paales cada 24 horas a medida   que el beb sigue creciendo y desarrollndose.  Defeca al menos 3 veces en 24 horas a los 5 das de vida. La materia fecal debe ser blanda y amarillenta.  Defeca al menos 3 veces en 24 horas a los 7 das de vida. La materia fecal debe ser grumosa y amarillenta.  No registra una prdida de peso mayor del 10% del peso al nacer durante los primeros 3 das de vida.  Aumenta de peso un promedio de 4 a 7onzas (113 a 198g) por semana despus de los 4 das de vida.  Aumenta de peso, diariamente, de manera uniforme a partir de los 5 das de vida, sin registrar prdida de peso despus de las 2semanas de vida. Despus de alimentarse, es posible que el beb regurgite una pequea cantidad. Esto es frecuente. FRECUENCIA Y DURACIN DE LA LACTANCIA MATERNA El amamantamiento frecuente la ayudar a producir ms leche y a prevenir problemas de dolor en los pezones e hinchazn en las mamas. Alimente al beb cuando muestre signos de hambre o si siente la  necesidad de reducir la congestin de las mamas. Esto se denomina "lactancia a demanda". Evite el uso del chupete mientras trabaja para establecer la lactancia (las primeras 4 a 6 semanas despus del nacimiento del beb). Despus de este perodo, podr ofrecerle un chupete. Las investigaciones demostraron que el uso del chupete durante el primer ao de vida del beb disminuye el riesgo de desarrollar el sndrome de muerte sbita del lactante (SMSL). Permita que el nio se alimente en cada mama todo lo que desee. Contine amamantando al beb hasta que haya terminado de alimentarse. Cuando el beb se desprende o se queda dormido mientras se est alimentando de la primera mama, ofrzcale la segunda. Debido a que, con frecuencia, los recin nacidos permanecen somnolientos las primeras semanas de vida, es posible que deba despertar al beb para alimentarlo. Los horarios de lactancia varan de un beb a otro. Sin embargo, las siguientes reglas pueden servir como gua para ayudarla a garantizar que el beb se alimenta adecuadamente:  Se puede amamantar a los recin nacidos (bebs de 4 semanas o menos de vida) cada 1 a 3 horas.  No deben transcurrir ms de 3 horas durante el da o 5 horas durante la noche sin que se amamante a los recin nacidos.  Debe amamantar al beb 8 veces como mnimo en un perodo de 24 horas, hasta que comience a introducir slidos en su dieta, a los 6 meses de vida aproximadamente. EXTRACCIN DE LECHE MATERNA La extraccin y el almacenamiento de la leche materna le permiten asegurarse de que el beb se alimente exclusivamente de leche materna, aun en momentos en los que no puede amamantar. Esto tiene especial importancia si debe regresar al trabajo en el perodo en que an est amamantando o si no puede estar presente en los momentos en que el beb debe alimentarse. Su asesor en lactancia puede orientarla sobre cunto tiempo es seguro almacenar leche materna. El sacaleche es un aparato  que le permite extraer leche de la mama a un recipiente estril. Luego, la leche materna extrada puede almacenarse en un refrigerador o congelador. Algunos sacaleches son manuales, mientras que otros son elctricos. Consulte a su asesor en lactancia qu tipo ser ms conveniente para usted. Los sacaleches se pueden comprar; sin embargo, algunos hospitales y grupos de apoyo a la lactancia materna alquilan sacaleches mensualmente. Un asesor en lactancia puede ensearle cmo extraer leche materna manualmente, en caso de que prefiera no usar un sacaleche.   CMO CUIDAR LAS MAMAS DURANTE LA LACTANCIA MATERNA Los pezones se secan, agrietan y duelen durante la lactancia materna. Las siguientes recomendaciones pueden ayudarla a mantener las mamas humectadas y sanas:  Evite usar jabn en los pezones.  Use un sostn de soporte. Aunque no son esenciales, las camisetas sin mangas o los sostenes especiales para amamantar estn diseados para acceder fcilmente a las mamas, para amamantar sin tener que quitarse todo el sostn o la camiseta. Evite usar sostenes con aro o sostenes muy ajustados.  Seque al aire sus pezones durante 3 a 4minutos despus de amamantar al beb.  Utilice solo apsitos de algodn en el sostn para absorber las prdidas de leche. La prdida de un poco de leche materna entre las tomas es normal.  Utilice lanolina sobre los pezones luego de amamantar. La lanolina ayuda a mantener la humedad normal de la piel. Si usa lanolina pura, no tiene que lavarse los pezones antes de volver a alimentar al beb. La lanolina pura no es txica para el beb. Adems, puede extraer manualmente algunas gotas de leche materna y masajear suavemente esa leche sobre los pezones, para que la leche se seque al aire. Durante las primeras semanas despus de dar a luz, algunas mujeres pueden experimentar hinchazn en las mamas (congestin mamaria). La congestin puede hacer que sienta las mamas pesadas, calientes y  sensibles al tacto. El pico de la congestin ocurre dentro de los 3 a 5 das despus del parto. Las siguientes recomendaciones pueden ayudarla a aliviar la congestin:  Vace por completo las mamas al amamantar o extraer leche. Puede aplicar calor hmedo en las mamas (en la ducha o con toallas hmedas para manos) antes de amamantar o extraer leche. Esto aumenta la circulacin y ayuda a que la leche fluya. Si el beb no vaca por completo las mamas cuando lo amamanta, extraiga la leche restante despus de que haya finalizado.  Use un sostn ajustado (para amamantar o comn) o una camiseta sin mangas durante 1 o 2 das para indicar al cuerpo que disminuya ligeramente la produccin de leche.  Aplique compresas de hielo sobre las mamas, a menos que le resulte demasiado incmodo.  Asegrese de que el beb est prendido y se encuentre en la posicin correcta mientras lo alimenta. Si la congestin persiste luego de 48 horas o despus de seguir estas recomendaciones, comunquese con su mdico o un asesor en lactancia. RECOMENDACIONES GENERALES PARA EL CUIDADO DE LA SALUD DURANTE LA LACTANCIA MATERNA  Consuma alimentos saludables. Alterne comidas y colaciones, y coma 3 de cada una por da. Dado que lo que come afecta la leche materna, es posible que algunas comidas hagan que su beb se vuelva ms irritable de lo habitual. Evite comer este tipo de alimentos si percibe que afectan de manera negativa al beb.  Beba leche, jugos de fruta y agua para satisfacer su sed (aproximadamente 10 vasos al da).  Descanse con frecuencia, reljese y tome sus vitaminas prenatales para evitar la fatiga, el estrs y la anemia.  Contine con los autocontroles de la mama.  Evite masticar y fumar tabaco. Las sustancias qumicas de los cigarrillos que pasan a la leche materna y la exposicin al humo ambiental del tabaco pueden daar al beb.  No consuma alcohol ni drogas, incluida la marihuana. Algunos medicamentos, que  pueden ser perjudiciales para el beb, pueden pasar a travs de la leche materna. Es importante que consulte a su mdico antes de tomar cualquier medicamento, incluidos todos los medicamentos recetados y de venta   libre, as como los suplementos vitamnicos y herbales. Puede quedar embarazada durante la lactancia. Si desea controlar la natalidad, consulte a su mdico cules son las opciones ms seguras para el beb. SOLICITE ATENCIN MDICA SI:  Usted siente que quiere dejar de amamantar o se siente frustrada con la lactancia.  Siente dolor en las mamas o en los pezones.  Sus pezones estn agrietados o sangran.  Sus pechos estn irritados, sensibles o calientes.  Tiene un rea hinchada en cualquiera de las mamas.  Siente escalofros o fiebre.  Tiene nuseas o vmitos.  Presenta una secrecin de otro lquido distinto de la leche materna de los pezones.  Sus mamas no se llenan antes de amamantar al beb para el quinto da despus del parto.  Se siente triste y deprimida.  El beb est demasiado somnoliento como para comer bien.  El beb tiene problemas para dormir.  Moja menos de 3 paales en 24 horas.  Defeca menos de 3 veces en 24 horas.  La piel del beb o la parte blanca de los ojos se vuelven amarillentas.  El beb no ha aumentado de peso a los 5 das de vida.  SOLICITE ATENCIN MDICA DE INMEDIATO SI:  El beb est muy cansado (letargo) y no se quiere despertar para comer.  Le sube la fiebre sin causa.  Esta informacin no tiene como fin reemplazar el consejo del mdico. Asegrese de hacerle al mdico cualquier pregunta que tenga. Document Released: 09/22/2005 Document Revised: 01/14/2016 Document Reviewed: 03/16/2013 Elsevier Interactive Patient Education  2017 [employer]  

## 2016-12-25 ENCOUNTER — Ambulatory Visit (INDEPENDENT_AMBULATORY_CARE_PROVIDER_SITE_OTHER): Payer: Medicaid Other | Admitting: Family Medicine

## 2016-12-25 VITALS — BP 118/59 | HR 66 | Wt 254.0 lb

## 2016-12-25 DIAGNOSIS — Z3480 Encounter for supervision of other normal pregnancy, unspecified trimester: Secondary | ICD-10-CM

## 2016-12-25 DIAGNOSIS — O9989 Other specified diseases and conditions complicating pregnancy, childbirth and the puerperium: Secondary | ICD-10-CM

## 2016-12-25 DIAGNOSIS — Z3483 Encounter for supervision of other normal pregnancy, third trimester: Secondary | ICD-10-CM

## 2016-12-25 DIAGNOSIS — N898 Other specified noninflammatory disorders of vagina: Secondary | ICD-10-CM

## 2016-12-25 MED ORDER — VALACYCLOVIR HCL 500 MG PO TABS: ORAL_TABLET | ORAL | 5 refills | Status: DC

## 2016-12-25 NOTE — Progress Notes (Signed)
   PRENATAL VISIT NOTE  Subjective:  Kristine SlaterJessica Cordova is a 26 y.o. G2P1001 at 1825w6d being seen today for ongoing prenatal care.  She is currently monitored for the following issues for this low-risk pregnancy and has Supervision of other normal pregnancy, antepartum; Rh negative state in antepartum period; Maternal morbid obesity, antepartum (HCC); Chronic GERD; Group B streptococcus urinary tract infection affecting pregnancy; Prehypertension; and Irregular periods/menstrual cycles on her problem list.  Patient reports vaginal lesion in the right upper labia, started a few days ago, but had burning and pain in that area several days prior to the lesions appearing. Has never had this before. .  Contractions: Irritability. Vag. Bleeding: None.  Movement: Present. Denies leaking of fluid.   The following portions of the patient's history were reviewed and updated as appropriate: allergies, current medications, past family history, past medical history, past social history, past surgical history and problem list. Problem list updated.  Objective:   Vitals:   12/25/16 1110  BP: (!) 118/59  Pulse: 66  Weight: 254 lb (115.2 kg)    Fetal Status:     Movement: Present     General:  Alert, oriented and cooperative. Patient is in no acute distress.  Skin: Skin is warm and dry. No rash noted.   Cardiovascular: Normal heart rate noted  Respiratory: Normal respiratory effort, no problems with respiration noted  Abdomen: Soft, gravid, appropriate for gestational age. Pain/Pressure: Present     Pelvic:  Cervical exam deferred       three small (2-323mm) ulcers on the right upper labia majora  Extremities: Normal range of motion.     Mental Status: Normal mood and affect. Normal behavior. Normal judgment and thought content.   Assessment and Plan:  Pregnancy: G2P1001 at 3325w6d  1. Supervision of other normal pregnancy, antepartum FHT and FH normal  2. Vaginal lesion Concerning for HSV. Will get  cultures and blood work. Start Valtrex while waiting for results.  - HSV(herpes smplx)abs-1+2(IgG+IgM)-bld - Herpes simplex virus culture  Preterm labor symptoms and general obstetric precautions including but not limited to vaginal bleeding, contractions, leaking of fluid and fetal movement were reviewed in detail with the patient. Please refer to After Visit Summary for other counseling recommendations.  No Follow-up on file.   Levie HeritageJacob J Vaishnavi Dalby, DO

## 2016-12-25 NOTE — Progress Notes (Signed)
Patient complaining of vaginal bump with itching in the area. Patient first noticed the bump on Tuesday (12-23-16)of this week . Armandina StammerJennifer Artavious Trebilcock RN BSN

## 2016-12-27 LAB — HSV(HERPES SMPLX)ABS-I+II(IGG+IGM)-BLD
HSV 2 GLYCOPROTEIN G AB, IGG: 9.38 {index} — AB (ref 0.00–0.90)
HSVI/II Comb IgM: <0.91 Ratio (ref 0.00–0.90)

## 2016-12-28 LAB — HERPES SIMPLEX VIRUS CULTURE

## 2016-12-29 ENCOUNTER — Encounter: Payer: Self-pay | Admitting: Family Medicine

## 2016-12-29 DIAGNOSIS — B009 Herpesviral infection, unspecified: Secondary | ICD-10-CM | POA: Insufficient documentation

## 2016-12-29 DIAGNOSIS — O98513 Other viral diseases complicating pregnancy, third trimester: Secondary | ICD-10-CM

## 2017-01-01 ENCOUNTER — Encounter: Payer: Self-pay | Admitting: Family Medicine

## 2017-01-01 ENCOUNTER — Ambulatory Visit (INDEPENDENT_AMBULATORY_CARE_PROVIDER_SITE_OTHER): Payer: Medicaid Other | Admitting: Family Medicine

## 2017-01-01 VITALS — BP 118/63 | HR 76 | Wt 247.0 lb

## 2017-01-01 DIAGNOSIS — O26843 Uterine size-date discrepancy, third trimester: Secondary | ICD-10-CM

## 2017-01-01 DIAGNOSIS — B009 Herpesviral infection, unspecified: Secondary | ICD-10-CM

## 2017-01-01 DIAGNOSIS — B951 Streptococcus, group B, as the cause of diseases classified elsewhere: Secondary | ICD-10-CM

## 2017-01-01 DIAGNOSIS — O98513 Other viral diseases complicating pregnancy, third trimester: Secondary | ICD-10-CM

## 2017-01-01 DIAGNOSIS — Z3480 Encounter for supervision of other normal pregnancy, unspecified trimester: Secondary | ICD-10-CM

## 2017-01-01 DIAGNOSIS — O2343 Unspecified infection of urinary tract in pregnancy, third trimester: Secondary | ICD-10-CM

## 2017-01-01 NOTE — Progress Notes (Signed)
   PRENATAL VISIT NOTE  Subjective:  Kristine Cordova is a 26 y.o. G2P1001 at 4019w6d being seen today for ongoing prenatal care.  She is currently monitored for the following issues for this high-risk pregnancy and has Supervision of other normal pregnancy, antepartum; Rh negative state in antepartum period; Maternal morbid obesity, antepartum (HCC); Chronic GERD; Group B streptococcus urinary tract infection affecting pregnancy; Prehypertension; Irregular periods/menstrual cycles; and HSV-2 infection complicating pregnancy, third trimester on her problem list.  Patient reports lesions improving. Continue Valtrex, will start with prophylaxis once completed treatment..  Contractions: Irritability. Vag. Bleeding: None.  Movement: Present. Denies leaking of fluid.   The following portions of the patient's history were reviewed and updated as appropriate: allergies, current medications, past family history, past medical history, past social history, past surgical history and problem list. Problem list updated.  Objective:   Vitals:   01/01/17 0900  BP: 118/63  Pulse: 76  Weight: 247 lb (112 kg)    Fetal Status: Fetal Heart Rate (bpm): 145   Movement: Present     General:  Alert, oriented and cooperative. Patient is in no acute distress.  Skin: Skin is warm and dry. No rash noted.   Cardiovascular: Normal heart rate noted  Respiratory: Normal respiratory effort, no problems with respiration noted  Abdomen: Soft, gravid, appropriate for gestational age. Pain/Pressure: Present     Pelvic:  Cervical exam deferred        Extremities: Normal range of motion.  Edema: None  Mental Status: Normal mood and affect. Normal behavior. Normal judgment and thought content.   Assessment and Plan:  Pregnancy: G2P1001 at 5719w6d  1. Supervision of other normal pregnancy, antepartum FHT normal  2. HSV-2 infection complicating pregnancy, third trimester Continue valtrex.  3. Group B Streptococcus urinary  tract infection affecting pregnancy in third trimester Intrapartum prophylaxis  4. Size of fetus inconsistent with dates in third trimester US for growth   Preterm labor symptoms and general obstetric precautions including but not limited to vaginal bleeding, contractions, leaking of fluid and fetal movement were reviewed in detail with the patient. Please refer to After Visit Summary for other counseling recommendations.  No Follow-up on file.   Kristine HeritageJacob J Sanaiyah Kirchhoff, DO

## 2017-01-13 ENCOUNTER — Ambulatory Visit (HOSPITAL_COMMUNITY)
Admission: RE | Admit: 2017-01-13 | Discharge: 2017-01-13 | Disposition: A | Discharge status: Home / Self Care | Payer: Medicaid Other | Source: Ambulatory Visit | Attending: Family Medicine | Admitting: Family Medicine

## 2017-01-13 DIAGNOSIS — Z3A34 34 weeks gestation of pregnancy: Secondary | ICD-10-CM | POA: Diagnosis not present

## 2017-01-13 DIAGNOSIS — O360130 Maternal care for anti-D [Rh] antibodies, third trimester, not applicable or unspecified: Secondary | ICD-10-CM | POA: Insufficient documentation

## 2017-01-13 DIAGNOSIS — O26843 Uterine size-date discrepancy, third trimester: Secondary | ICD-10-CM | POA: Diagnosis not present

## 2017-01-13 DIAGNOSIS — O99213 Obesity complicating pregnancy, third trimester: Secondary | ICD-10-CM | POA: Insufficient documentation

## 2017-01-13 DIAGNOSIS — Z3480 Encounter for supervision of other normal pregnancy, unspecified trimester: Secondary | ICD-10-CM

## 2017-01-15 ENCOUNTER — Ambulatory Visit (INDEPENDENT_AMBULATORY_CARE_PROVIDER_SITE_OTHER): Payer: Medicaid Other | Admitting: Family Medicine

## 2017-01-15 ENCOUNTER — Encounter: Payer: Self-pay | Admitting: Family Medicine

## 2017-01-15 VITALS — BP 119/72 | HR 78 | Wt 251.0 lb

## 2017-01-15 DIAGNOSIS — O98513 Other viral diseases complicating pregnancy, third trimester: Secondary | ICD-10-CM

## 2017-01-15 DIAGNOSIS — B009 Herpesviral infection, unspecified: Secondary | ICD-10-CM

## 2017-01-15 DIAGNOSIS — Z3480 Encounter for supervision of other normal pregnancy, unspecified trimester: Secondary | ICD-10-CM

## 2017-01-15 DIAGNOSIS — Z3483 Encounter for supervision of other normal pregnancy, third trimester: Secondary | ICD-10-CM

## 2017-01-15 DIAGNOSIS — O26843 Uterine size-date discrepancy, third trimester: Secondary | ICD-10-CM

## 2017-01-15 NOTE — Progress Notes (Signed)
   PRENATAL VISIT NOTE  Subjective:  Kristine Cordova is a 26 y.o. G2P1001 at [redacted]w[redacted]d being seen today for ongoing prenatal care.  She is currently monitored for the following issues for this low-risk pregnancy and has Supervision of other normal pregnancy, antepartum; Rh negative state in antepartum period; Maternal morbid obesity, antepartum (HCC); Chronic GERD; Group B streptococcus urinary tract infection affecting pregnancy; Prehypertension; Irregular periods/menstrual cycles; and HSV-2 infection complicating pregnancy, third trimester on her problem list.  Patient reports occasional contractions.  Contractions: Irritability. Vag. Bleeding: None.  Movement: Present. Denies leaking of fluid.   The following portions of the patient's history were reviewed and updated as appropriate: allergies, current medications, past family history, past medical history, past social history, past surgical history and problem list. Problem list updated.  Objective:   Vitals:   01/15/17 0919  BP: 119/72  Pulse: 78  Weight: 251 lb (113.9 kg)    Fetal Status: Fetal Heart Rate (bpm): 142   Movement: Present     General:  Alert, oriented and cooperative. Patient is in no acute distress.  Skin: Skin is warm and dry. No rash noted.   Cardiovascular: Normal heart rate noted  Respiratory: Normal respiratory effort, no problems with respiration noted  Abdomen: Soft, gravid, appropriate for gestational age. Pain/Pressure: Present     Pelvic:  Cervical exam deferred        Extremities: Normal range of motion.  Edema: Trace  Mental Status: Normal mood and affect. Normal behavior. Normal judgment and thought content.   Assessment and Plan:  Pregnancy: G2P1001 at [redacted]w[redacted]d  1. Supervision of other normal pregnancy, antepartum FHT normal. Discussed LGA baby. Will repeat US in 4 weeks. 36 week cultures at next appt.  2. HSV-2 infection complicating pregnancy, third trimester Continue valtrex.  3. Size of fetus  inconsistent with dates in third trimester - [place] MFM OB FOLLOW UP; Future  Preterm labor symptoms and general obstetric precautions including but not limited to vaginal bleeding, contractions, leaking of fluid and fetal movement were reviewed in detail with the patient. Please refer to After Visit Summary for other counseling recommendations.  No Follow-up on file.   Levie Heritage, DO

## 2017-01-21 ENCOUNTER — Encounter: Payer: Self-pay | Admitting: Family Medicine

## 2017-01-21 ENCOUNTER — Ambulatory Visit (INDEPENDENT_AMBULATORY_CARE_PROVIDER_SITE_OTHER): Payer: Medicaid Other | Admitting: Family Medicine

## 2017-01-21 VITALS — BP 129/58 | HR 77 | Wt 254.0 lb

## 2017-01-21 DIAGNOSIS — B009 Herpesviral infection, unspecified: Secondary | ICD-10-CM

## 2017-01-21 DIAGNOSIS — Z3483 Encounter for supervision of other normal pregnancy, third trimester: Secondary | ICD-10-CM

## 2017-01-21 DIAGNOSIS — O2343 Unspecified infection of urinary tract in pregnancy, third trimester: Secondary | ICD-10-CM

## 2017-01-21 DIAGNOSIS — O98513 Other viral diseases complicating pregnancy, third trimester: Secondary | ICD-10-CM

## 2017-01-21 DIAGNOSIS — O3663X0 Maternal care for excessive fetal growth, third trimester, not applicable or unspecified: Secondary | ICD-10-CM

## 2017-01-21 DIAGNOSIS — B951 Streptococcus, group B, as the cause of diseases classified elsewhere: Secondary | ICD-10-CM

## 2017-01-21 DIAGNOSIS — Z3480 Encounter for supervision of other normal pregnancy, unspecified trimester: Secondary | ICD-10-CM

## 2017-01-21 NOTE — Progress Notes (Signed)
   PRENATAL VISIT NOTE  Subjective:  Kristine Cordova is a 26 y.o. G2P1001 at [redacted]w[redacted]d being seen today for ongoing prenatal care.  She is currently monitored for the following issues for this low-risk pregnancy and has Supervision of other normal pregnancy, antepartum; Rh negative state in antepartum period; Maternal morbid obesity, antepartum (HCC); Chronic GERD; Group B streptococcus urinary tract infection affecting pregnancy; Prehypertension; Irregular periods/menstrual cycles; and HSV-2 infection complicating pregnancy, third trimester on her problem list.  Patient reports difficulty sleeping, occsaional contractions.  Contractions: Irritability. Vag. Bleeding: None.  Movement: Present. Denies leaking of fluid.   The following portions of the patient's history were reviewed and updated as appropriate: allergies, current medications, past family history, past medical history, past social history, past surgical history and problem list. Problem list updated.  Objective:   Vitals:   01/21/17 1509  BP: (!) 129/58  Pulse: 77  Weight: 254 lb (115.2 kg)    Fetal Status: Fetal Heart Rate (bpm): 143   Movement: Present     General:  Alert, oriented and cooperative. Patient is in no acute distress.  Skin: Skin is warm and dry. No rash noted.   Cardiovascular: Normal heart rate noted  Respiratory: Normal respiratory effort, no problems with respiration noted  Abdomen: Soft, gravid, appropriate for gestational age. Pain/Pressure: Present     Pelvic:  Cervical exam deferred        Extremities: Normal range of motion.     Mental Status: Normal mood and affect. Normal behavior. Normal judgment and thought content.   Assessment and Plan:  Pregnancy: G2P1001 at [redacted]w[redacted]d  1. Supervision of other normal pregnancy, antepartum FHT normal.  2. HSV-2 infection complicating pregnancy, third trimester Continue valtrex  3. Fetal macrosomia in third trimester, single or unspecified fetus EFW 3220g at  [redacted]w[redacted]d. Recheck [place] on 5/11. Random CBG 87.  4. GBS bacteriuria Intrapartum treatment  Preterm labor symptoms and general obstetric precautions including but not limited to vaginal bleeding, contractions, leaking of fluid and fetal movement were reviewed in detail with the patient. Please refer to After Visit Summary for other counseling recommendations.  No Follow-up on file.   Levie Heritage, DO

## 2017-01-21 NOTE — Progress Notes (Signed)
Random fingerstick glucose: 87. Armandina Stammer RN BSN

## 2017-01-28 ENCOUNTER — Encounter: Payer: Self-pay | Admitting: Obstetrics & Gynecology

## 2017-01-28 ENCOUNTER — Ambulatory Visit (INDEPENDENT_AMBULATORY_CARE_PROVIDER_SITE_OTHER): Payer: Medicaid Other | Admitting: Obstetrics & Gynecology

## 2017-01-28 VITALS — BP 127/65 | HR 76 | Wt 257.0 lb

## 2017-01-28 DIAGNOSIS — O98513 Other viral diseases complicating pregnancy, third trimester: Secondary | ICD-10-CM

## 2017-01-28 DIAGNOSIS — O3663X0 Maternal care for excessive fetal growth, third trimester, not applicable or unspecified: Secondary | ICD-10-CM

## 2017-01-28 DIAGNOSIS — R030 Elevated blood-pressure reading, without diagnosis of hypertension: Secondary | ICD-10-CM

## 2017-01-28 DIAGNOSIS — Z6791 Unspecified blood type, Rh negative: Secondary | ICD-10-CM

## 2017-01-28 DIAGNOSIS — O99210 Obesity complicating pregnancy, unspecified trimester: Secondary | ICD-10-CM

## 2017-01-28 DIAGNOSIS — O2343 Unspecified infection of urinary tract in pregnancy, third trimester: Secondary | ICD-10-CM

## 2017-01-28 DIAGNOSIS — Z3480 Encounter for supervision of other normal pregnancy, unspecified trimester: Secondary | ICD-10-CM

## 2017-01-28 DIAGNOSIS — B951 Streptococcus, group B, as the cause of diseases classified elsewhere: Secondary | ICD-10-CM

## 2017-01-28 DIAGNOSIS — O99213 Obesity complicating pregnancy, third trimester: Secondary | ICD-10-CM

## 2017-01-28 DIAGNOSIS — O09893 Supervision of other high risk pregnancies, third trimester: Secondary | ICD-10-CM

## 2017-01-28 DIAGNOSIS — O2340 Unspecified infection of urinary tract in pregnancy, unspecified trimester: Secondary | ICD-10-CM

## 2017-01-28 DIAGNOSIS — O26899 Other specified pregnancy related conditions, unspecified trimester: Secondary | ICD-10-CM

## 2017-01-28 DIAGNOSIS — B009 Herpesviral infection, unspecified: Secondary | ICD-10-CM

## 2017-01-28 NOTE — Patient Instructions (Signed)
Natural Childbirth Natural childbirth is going through labor and delivery without any drugs to relieve pain. Additionally, fetal monitors are not used, a delivery is not done, and a surgical cut to enlarge the vaginal opening (episiotomy) is not made. With the help of a birthing professional (midwife or health care provider), you direct your own labor and delivery. Many women choose natural childbirth because it makes them feel more in control and in touch with their labor and delivery. Some woman also choose natural childbirth because they are concerned about medicines affecting them and their babies. Pregnant women with a high-risk pregnancy should not attempt natural childbirth. It is better to deliver the infant in a hospital if an emergency situation arises. Sometimes, a health care provider has to get involved for the health and safety of the mother and infant. Techniques for natural childbirth  The Lamaze method-This method teaches parents that having a baby is normal, healthy, and natural. It also teaches the mother to take a neutral position regarding pain medicine and numbing medicines and to make an informed decision about using these medicines when the time comes.  The Bradley method (also called husband-coached birth)-This method teaches the father or partner to be the birth coach. It encourages the mother to exercise and eat a balanced, nutritious diet. It also involves relaxation and deep breathing exercises and preparing the parents for emergency situations. Methods of dealing with labor pain and delivery  Meditation.  Yoga.  Hypnosis.  Acupuncture.  Massage.  Changing positions (walking, rocking, showering, leaning on birth balls).  Lying in warm water or a whirlpool bath.  Finding an activity that keeps your mind off of the labor pain.  Listening to soft music.  Focusing on a particular object (visual imagery). Before going into labor  Be sure you and your spouse or  partner are in agreement about having a natural childbirth.  Decide if your health care provider or a midwife will deliver your baby.  Decide if you will have your baby in the hospital, at a birthing center, or at home.  If you have children, make plans to have someone take care of them when you go to the hospital or birthing center.  Know the distance and the time it takes to go to the hospital or birthing center. Practice going there and time it to be sure.  Have a bag packed with a nightgown, bathrobe, and toiletries. Be ready to take it with you when you go into labor.  Keep phone numbers of your family and friends handy if you need to call someone when you go into labor.  Your spouse or partner should go to all the natural childbirth technique classes.  Talk with your health care provider about the possibility of a medical emergency and what will happen if that occurs. Advantages of natural childbirth  You are in control of your labor and delivery.  You will not take medicines that could affect you and the baby.  There are no invasive procedures, such as an episiotomy.  You and your spouse or partner will work together, which can increase your bond with each other.  In most delivery centers, your family and friends can be involved in the labor and delivery process. Disadvantages of natural childbirth  You will experience pain during your labor and delivery.  The methods of helping relieve your labor pains may not work for you.  You may feel disappointed if you decide to change your mind during labor and not   have a natural childbirth. After the delivery  You will be very tired.  You will be uncomfortable because of your uterus contracting. You will feel soreness around the vagina.  You may feel cold and shaky. This is a natural reaction. This information is not intended to replace advice given to you by your health care provider. Make sure you discuss any questions you  have with your health care provider. Document Released: 09/04/2008 Document Revised: 02/28/2016 Document Reviewed: 05/30/2013 Elsevier Interactive Patient Education  2017 Elsevier Inc[employer]  

## 2017-01-28 NOTE — Progress Notes (Signed)
   PRENATAL VISIT NOTE  Subjective:  Kristine Cordova is a 26 y.o. G2P1001 at [redacted]w[redacted]d being seen today for ongoing prenatal care.  She is currently monitored for the following issues for this high-risk pregnancy and has Supervision of other normal pregnancy, antepartum; Rh negative state in antepartum period; Maternal morbid obesity, antepartum (HCC); Chronic GERD; Group B streptococcus urinary tract infection affecting pregnancy; Prehypertension; HSV-2 infection complicating pregnancy, third trimester; and Macrosomia on her problem list.  Patient reports no complaints.  Contractions: Irritability. Vag. Bleeding: None.  Movement: Present. Denies leaking of fluid.   The following portions of the patient's history were reviewed and updated as appropriate: allergies, current medications, past family history, past medical history, past social history, past surgical history and problem list. Problem list updated.  Objective:   Vitals:   01/28/17 1526  BP: 127/65  Pulse: 76  Weight: 257 lb (116.6 kg)    Fetal Status: Fetal Heart Rate (bpm): 138 Fundal Height: 48 cm Movement: Present     General:  Alert, oriented and cooperative. Patient is in no acute distress.  Skin: Skin is warm and dry. No rash noted.   Cardiovascular: Normal heart rate noted  Respiratory: Normal respiratory effort, no problems with respiration noted  Abdomen: Soft, gravid, appropriate for gestational age. Pain/Pressure: Present     Pelvic:  Cervical exam deferred        Extremities: Normal range of motion.     Mental Status: Normal mood and affect. Normal behavior. Normal judgment and thought content.   Assessment and Plan:  Pregnancy: G2P1001 at [redacted]w[redacted]d  1. Supervision of other normal pregnancy, antepartum  2. Rh negative state in antepartum period s/p rhogam  3. Maternal morbid obesity, antepartum (HCC)  - [place] MFM OB FOLLOW UP; Future  4. Group B Streptococcus urinary tract infection affecting pregnancy,  antepartum Needs PCN in labor  5. HSV-2 infection complicating pregnancy, third trimester Pt is currently on suppresion  6. Prehypertension BP WNL  7. Macrosomia- suspected  - [place] MFM OB FOLLOW UP; Future I have reviewed with pt possible modes of delivery. At present I would rec an attempt at a vag delivery. She has delivered a ~9# infant prev.  She has a vertical incision that is NOT from a c-section!  I have told her that it is possible with a TOL of labor that I she arrests that she may require a primary c-section. Pt understands. I have reviewed with her the risk of shoulder dystocia. We can discuss this further at her next visit after the next [place].   Preterm labor symptoms and general obstetric precautions including but not limited to vaginal bleeding, contractions, leaking of fluid and fetal movement were reviewed in detail with the patient. Please refer to After Visit Summary for other counseling recommendations.  Return in about 1 week (around 02/04/2017).   Willodean Rosenthal, MD

## 2017-02-04 ENCOUNTER — Encounter: Payer: Medicaid Other | Admitting: Obstetrics & Gynecology

## 2017-02-05 ENCOUNTER — Ambulatory Visit (HOSPITAL_COMMUNITY)
Admission: RE | Admit: 2017-02-05 | Discharge: 2017-02-05 | Disposition: A | Discharge status: Home / Self Care | Payer: Medicaid Other | Source: Ambulatory Visit | Attending: Family Medicine | Admitting: Family Medicine

## 2017-02-05 ENCOUNTER — Other Ambulatory Visit: Payer: Self-pay | Admitting: Family Medicine

## 2017-02-05 DIAGNOSIS — O26843 Uterine size-date discrepancy, third trimester: Secondary | ICD-10-CM

## 2017-02-05 DIAGNOSIS — O99213 Obesity complicating pregnancy, third trimester: Secondary | ICD-10-CM | POA: Insufficient documentation

## 2017-02-05 DIAGNOSIS — Z3A37 37 weeks gestation of pregnancy: Secondary | ICD-10-CM

## 2017-02-06 ENCOUNTER — Inpatient Hospital Stay (HOSPITAL_COMMUNITY)
Admission: AD | Admit: 2017-02-06 | Discharge: 2017-02-08 | DRG: 774 | Disposition: A | Discharge status: Home / Self Care | Payer: Medicaid Other | Source: Ambulatory Visit | Attending: Obstetrics and Gynecology | Admitting: Obstetrics and Gynecology

## 2017-02-06 ENCOUNTER — Encounter (HOSPITAL_COMMUNITY): Payer: Self-pay | Admitting: *Deleted

## 2017-02-06 ENCOUNTER — Inpatient Hospital Stay (HOSPITAL_COMMUNITY): Payer: Medicaid Other | Admitting: Anesthesiology

## 2017-02-06 ENCOUNTER — Ambulatory Visit (INDEPENDENT_AMBULATORY_CARE_PROVIDER_SITE_OTHER): Payer: Medicaid Other | Admitting: Obstetrics & Gynecology

## 2017-02-06 VITALS — BP 126/69 | HR 68 | Wt 257.0 lb

## 2017-02-06 DIAGNOSIS — O1002 Pre-existing essential hypertension complicating childbirth: Secondary | ICD-10-CM | POA: Diagnosis present

## 2017-02-06 DIAGNOSIS — O99214 Obesity complicating childbirth: Secondary | ICD-10-CM | POA: Diagnosis present

## 2017-02-06 DIAGNOSIS — Z6841 Body Mass Index (BMI) 40.0 and over, adult: Secondary | ICD-10-CM

## 2017-02-06 DIAGNOSIS — O9832 Other infections with a predominantly sexual mode of transmission complicating childbirth: Secondary | ICD-10-CM | POA: Diagnosis present

## 2017-02-06 DIAGNOSIS — O3663X0 Maternal care for excessive fetal growth, third trimester, not applicable or unspecified: Secondary | ICD-10-CM | POA: Diagnosis present

## 2017-02-06 DIAGNOSIS — K5792 Diverticulitis of intestine, part unspecified, without perforation or abscess without bleeding: Secondary | ICD-10-CM | POA: Diagnosis present

## 2017-02-06 DIAGNOSIS — O99210 Obesity complicating pregnancy, unspecified trimester: Secondary | ICD-10-CM

## 2017-02-06 DIAGNOSIS — O99213 Obesity complicating pregnancy, third trimester: Secondary | ICD-10-CM

## 2017-02-06 DIAGNOSIS — O4202 Full-term premature rupture of membranes, onset of labor within 24 hours of rupture: Secondary | ICD-10-CM | POA: Diagnosis present

## 2017-02-06 DIAGNOSIS — O26893 Other specified pregnancy related conditions, third trimester: Secondary | ICD-10-CM | POA: Diagnosis present

## 2017-02-06 DIAGNOSIS — B951 Streptococcus, group B, as the cause of diseases classified elsewhere: Secondary | ICD-10-CM

## 2017-02-06 DIAGNOSIS — O2343 Unspecified infection of urinary tract in pregnancy, third trimester: Secondary | ICD-10-CM

## 2017-02-06 DIAGNOSIS — K219 Gastro-esophageal reflux disease without esophagitis: Secondary | ICD-10-CM | POA: Diagnosis present

## 2017-02-06 DIAGNOSIS — Z3480 Encounter for supervision of other normal pregnancy, unspecified trimester: Secondary | ICD-10-CM

## 2017-02-06 DIAGNOSIS — Z6791 Unspecified blood type, Rh negative: Secondary | ICD-10-CM

## 2017-02-06 DIAGNOSIS — O9962 Diseases of the digestive system complicating childbirth: Secondary | ICD-10-CM | POA: Diagnosis present

## 2017-02-06 DIAGNOSIS — Z87891 Personal history of nicotine dependence: Secondary | ICD-10-CM

## 2017-02-06 DIAGNOSIS — Z3A38 38 weeks gestation of pregnancy: Secondary | ICD-10-CM | POA: Diagnosis not present

## 2017-02-06 DIAGNOSIS — A6000 Herpesviral infection of urogenital system, unspecified: Secondary | ICD-10-CM | POA: Diagnosis present

## 2017-02-06 LAB — URINALYSIS, ROUTINE W REFLEX MICROSCOPIC
Bilirubin Urine: NEGATIVE
Glucose, UA: NEGATIVE mg/dL
KETONES UR: 20 mg/dL — AB
Nitrite: NEGATIVE
PH: 6 (ref 5.0–8.0)
PROTEIN: NEGATIVE mg/dL
Specific Gravity, Urine: 1.025 (ref 1.005–1.030)

## 2017-02-06 LAB — TYPE AND SCREEN
ABO/RH(D): O NEG
ANTIBODY SCREEN: NEGATIVE

## 2017-02-06 LAB — CBC
HEMATOCRIT: 32.1 % — AB (ref 36.0–46.0)
HEMOGLOBIN: 10.5 g/dL — AB (ref 12.0–15.0)
MCH: 26.9 pg (ref 26.0–34.0)
MCHC: 32.7 g/dL (ref 30.0–36.0)
MCV: 82.1 fL (ref 78.0–100.0)
Platelets: 250 10*3/uL (ref 150–400)
RBC: 3.91 MIL/uL (ref 3.87–5.11)
RDW: 14.3 % (ref 11.5–15.5)
WBC: 8.5 10*3/uL (ref 4.0–10.5)

## 2017-02-06 LAB — COMPREHENSIVE METABOLIC PANEL
ALBUMIN: 2.8 g/dL — AB (ref 3.5–5.0)
ALK PHOS: 190 U/L — AB (ref 38–126)
ALT: 30 U/L (ref 14–54)
ANION GAP: 9 (ref 5–15)
AST: 24 U/L (ref 15–41)
BUN: 10 mg/dL (ref 6–20)
CALCIUM: 8.7 mg/dL — AB (ref 8.9–10.3)
CHLORIDE: 106 mmol/L (ref 101–111)
CO2: 19 mmol/L — AB (ref 22–32)
CREATININE: 0.54 mg/dL (ref 0.44–1.00)
GFR calc non Af Amer: >60 mL/min (ref >60)
GLUCOSE: 110 mg/dL — AB (ref 65–99)
Potassium: 3.7 mmol/L (ref 3.5–5.1)
SODIUM: 134 mmol/L — AB (ref 135–145)
Total Bilirubin: 0.5 mg/dL (ref 0.3–1.2)
Total Protein: 6.5 g/dL (ref 6.5–8.1)

## 2017-02-06 LAB — PROTEIN / CREATININE RATIO, URINE
Creatinine, Urine: 173 mg/dL
PROTEIN CREATININE RATIO: 0.17 mg/mg{creat} — AB (ref 0.00–0.15)
TOTAL PROTEIN, URINE: 29 mg/dL

## 2017-02-06 LAB — POCT FERN TEST: POCT Fern Test: POSITIVE

## 2017-02-06 MED ORDER — DIPHENHYDRAMINE HCL 50 MG/ML IJ SOLN: 12.5000 mg | INTRAMUSCULAR | Status: DC | PRN

## 2017-02-06 MED ORDER — EPHEDRINE 5 MG/ML INJ: 10.0000 mg | INTRAVENOUS | Status: DC | PRN

## 2017-02-06 MED ORDER — PHENYLEPHRINE 40 MCG/ML (10ML) SYRINGE FOR IV PUSH (FOR BLOOD PRESSURE SUPPORT)
80.0000 ug | PREFILLED_SYRINGE | INTRAVENOUS | Status: DC | PRN
  Filled 2017-02-06: qty 5
  Filled 2017-02-06: qty 10

## 2017-02-06 MED ORDER — FENTANYL 2.5 MCG/ML BUPIVACAINE 1/10 % EPIDURAL INFUSION (WH - ANES)
14.0000 mL/h | INTRAMUSCULAR | Status: DC | PRN
  Administered 2017-02-06: 12 mL/h via EPIDURAL
  Filled 2017-02-06: qty 100

## 2017-02-06 MED ORDER — PENICILLIN G POT IN DEXTROSE 60000 UNIT/ML IV SOLN
3.0000 10*6.[IU] | INTRAVENOUS | Status: DC
  Administered 2017-02-06: 3 10*6.[IU] via INTRAVENOUS
  Filled 2017-02-06 (×8): qty 50

## 2017-02-06 MED ORDER — LACTATED RINGERS IV SOLN: 500.0000 mL | Freq: Once | INTRAVENOUS | Status: DC

## 2017-02-06 MED ORDER — PHENYLEPHRINE 40 MCG/ML (10ML) SYRINGE FOR IV PUSH (FOR BLOOD PRESSURE SUPPORT): 80.0000 ug | PREFILLED_SYRINGE | INTRAVENOUS | Status: DC | PRN

## 2017-02-06 MED ORDER — PENICILLIN G POTASSIUM 5000000 UNITS IJ SOLR
5.0000 10*6.[IU] | Freq: Once | INTRAMUSCULAR | Status: AC
  Administered 2017-02-06: 5 10*6.[IU] via INTRAVENOUS
  Filled 2017-02-06: qty 5

## 2017-02-06 MED ORDER — LIDOCAINE HCL (PF) 1 % IJ SOLN
INTRAMUSCULAR | Status: DC | PRN
  Administered 2017-02-06 (×2): 6 mL via EPIDURAL

## 2017-02-06 MED ORDER — FENTANYL CITRATE (PF) 100 MCG/2ML IJ SOLN: 100.0000 ug | INTRAMUSCULAR | Status: DC | PRN

## 2017-02-06 MED ORDER — PHENYLEPHRINE 40 MCG/ML (10ML) SYRINGE FOR IV PUSH (FOR BLOOD PRESSURE SUPPORT)
80.0000 ug | PREFILLED_SYRINGE | INTRAVENOUS | Status: DC | PRN
  Filled 2017-02-06: qty 5

## 2017-02-06 MED ORDER — OXYTOCIN 40 UNITS IN LACTATED RINGERS INFUSION - SIMPLE MED
1.0000 m[IU]/min | INTRAVENOUS | Status: DC
  Administered 2017-02-06: 2 m[IU]/min via INTRAVENOUS
  Administered 2017-02-07: 999 m[IU]/h via INTRAVENOUS
  Filled 2017-02-06: qty 1000

## 2017-02-06 MED ORDER — EPHEDRINE 5 MG/ML INJ
10.0000 mg | INTRAVENOUS | Status: DC | PRN
  Filled 2017-02-06: qty 2

## 2017-02-06 MED ORDER — OXYTOCIN BOLUS FROM INFUSION: 500.0000 mL | Freq: Once | INTRAVENOUS | Status: DC

## 2017-02-06 MED ORDER — LACTATED RINGERS IV SOLN
500.0000 mL | INTRAVENOUS | Status: DC | PRN
  Administered 2017-02-06 (×2): 500 mL via INTRAVENOUS

## 2017-02-06 MED ORDER — LACTATED RINGERS IV SOLN
INTRAVENOUS | Status: DC
  Administered 2017-02-06: 16:00:00 via INTRAVENOUS

## 2017-02-06 MED ORDER — TERBUTALINE SULFATE 1 MG/ML IJ SOLN
0.2500 mg | Freq: Once | INTRAMUSCULAR | Status: DC | PRN
  Filled 2017-02-06: qty 1

## 2017-02-06 MED ORDER — ACETAMINOPHEN 325 MG PO TABS: 650.0000 mg | ORAL_TABLET | ORAL | Status: DC | PRN

## 2017-02-06 MED ORDER — OXYCODONE-ACETAMINOPHEN 5-325 MG PO TABS: 2.0000 | ORAL_TABLET | ORAL | Status: DC | PRN

## 2017-02-06 MED ORDER — LIDOCAINE HCL (PF) 1 % IJ SOLN
30.0000 mL | INTRAMUSCULAR | Status: DC | PRN
  Filled 2017-02-06: qty 30

## 2017-02-06 MED ORDER — ONDANSETRON HCL 4 MG/2ML IJ SOLN: 4.0000 mg | Freq: Four times a day (QID) | INTRAMUSCULAR | Status: DC | PRN

## 2017-02-06 MED ORDER — OXYCODONE-ACETAMINOPHEN 5-325 MG PO TABS: 1.0000 | ORAL_TABLET | ORAL | Status: DC | PRN

## 2017-02-06 MED ORDER — SOD CITRATE-CITRIC ACID 500-334 MG/5ML PO SOLN
30.0000 mL | ORAL | Status: DC | PRN
Start: 2017-02-06 — End: 2017-02-07

## 2017-02-06 MED ORDER — OXYTOCIN 40 UNITS IN LACTATED RINGERS INFUSION - SIMPLE MED: 2.5000 [IU]/h | INTRAVENOUS | Status: DC

## 2017-02-06 NOTE — Progress Notes (Signed)
Vitals:   02/06/17 2214 02/06/17 2215  BP:    Pulse: 69   Resp:  20  Temp:  98.3 F (36.8 C)   Comfortable w/epidural Ctx pattern q 2-6 minutes.  FHR cat 1 now.  Had started pitocin a few hours ago, and after 30 minutes, had a 6 minute tetanic cx which resulted in a 6 minute bradycardia. Pit was d/c'd and FHR recovered.  WIll start pitocin now at 1 mu/min.

## 2017-02-06 NOTE — Anesthesia Procedure Notes (Signed)
Epidural Patient location during procedure: OB Start time: 02/06/2017 9:45 PM End time: 02/06/2017 9:57 PM  Staffing Anesthesiologist: Jairo BenJACKSON, Jaedin Regina Performed: anesthesiologist   Preanesthetic Checklist Completed: patient identified, surgical consent, pre-op evaluation, timeout performed, IV checked, risks and benefits discussed and monitors and equipment checked  Epidural Patient position: sitting Prep: site prepped and draped and DuraPrep Patient monitoring: blood pressure, continuous pulse ox and heart rate Approach: midline Location: L3-L4 Injection technique: LOR air  Needle:  Needle type: Tuohy  Needle gauge: 17 G Needle length: 9 cm Needle insertion depth: 6 cm Catheter type: closed end flexible Catheter size: 19 Gauge Catheter at skin depth: 12 cm Test dose: negative (1% lidocaine)  Assessment Events: blood not aspirated, injection not painful, no injection resistance, negative IV test and no paresthesia  Additional Notes Pt identified in Labor room.  Monitors applied. Working IV access confirmed. Sterile prep, drape lumbar spine.  1% lido local L 3,4.  #17ga Touhy LOR air at 6 cm L 3,4, cath in easily to 12 cm skin. Test dose OK, cath dosed and infusion begun.  Patient asymptomatic, VSS, no heme aspirated, tolerated well.  Kristine Craze Shantanique Hodo, MD

## 2017-02-06 NOTE — Progress Notes (Signed)
   PRENATAL VISIT NOTE  Subjective:  Kristine Cordova is a 26 y.o. G2P1001 at 681w0d being seen today for ongoing prenatal care.  She is currently monitored for the following issues for this high-risk pregnancy and has Supervision of other normal pregnancy, antepartum; Rh negative state in antepartum period; Maternal morbid obesity, antepartum (HCC); Chronic GERD; Group B streptococcus urinary tract infection affecting pregnancy; Prehypertension; HSV-2 infection complicating pregnancy, third trimester; and Macrosomia on her problem list.  Patient reports no complaints.  Contractions: Irritability. Vag. Bleeding: None.  Movement: Present. Denies leaking of fluid.   The following portions of the patient's history were reviewed and updated as appropriate: allergies, current medications, past family history, past medical history, past social history, past surgical history and problem list. Problem list updated.  Objective:   Vitals:   02/06/17 1051  BP: 126/69  Pulse: 68  Weight: 257 lb (116.6 kg)    Fetal Status:     Movement: Present     General:  Alert, oriented and cooperative. Patient is in no acute distress.  Skin: Skin is warm and dry. No rash noted.   Cardiovascular: Normal heart rate noted  Respiratory: Normal respiratory effort, no problems with respiration noted  Abdomen: Soft, gravid, appropriate for gestational age. Pain/Pressure: Present     Pelvic:  Cervical exam performed        Extremities: Normal range of motion.  Edema: Trace  Mental Status: Normal mood and affect. Normal behavior. Normal judgment and thought content.   Assessment and Plan:  Pregnancy: G2P1001 at 511w0d  1. Group B Streptococcus urinary tract infection affecting pregnancy in third trimester   2. Maternal morbid obesity, antepartum (HCC)   3. Supervision of other normal pregnancy, antepartum - with her advanced cervical dilation and + GBS status, I will have her go to the MAU in a couple of hours to  check for cervical change.  Term labor symptoms and general obstetric precautions including but not limited to vaginal bleeding, contractions, leaking of fluid and fetal movement were reviewed in detail with the patient. Please refer to After Visit Summary for other counseling recommendations.  No Follow-up on file.   Allie BossierMyra C Neyra Pettie, MD

## 2017-02-06 NOTE — H&P (Signed)
LABOR AND DELIVERY ADMISSION HISTORY AND PHYSICAL NOTE  Kristine Cordova is a 26 y.o. female G2P1001 with IUP at [redacted]w[redacted]d by 9 wk [place] presenting for SROM likely yesterday PM. She has had continue leaking since then. Was seen in the office today and 4-5 cm. She has had uncomplicated pregnancy.  She reports positive fetal movement. She denies leakage of fluid or vaginal bleeding.  Prenatal History/Complications:  Past Medical History: Past Medical History:  Diagnosis Date  . Diverticulitis   . Hypertension     Past Surgical History: Past Surgical History:  Procedure Laterality Date  . CHOLECYSTECTOMY    . OOPHORECTOMY      Obstetrical History: OB History    Gravida Para Term Preterm AB Living   2 1 1     1    SAB TAB Ectopic Multiple Live Births                  Social History: Social History   Social History  . Marital status: Single    Spouse name: N/A  . Number of children: N/A  . Years of education: N/A   Occupational History  . Psychiatric nurse    Social History Main Topics  . Smoking status: Former Smoker    Packs/day: 0.25    Years: 2.00    Types: Cigarettes  . Smokeless tobacco: Former Neurosurgeon  . Alcohol use No  . Drug use: No  . Sexual activity: Yes    Partners: Male   Other Topics Concern  . None   Social History Narrative  . None    Family History: Family History  Problem Relation Age of Onset  . Cancer Maternal Grandfather   . Diabetes Paternal Grandmother     Allergies: No Known Allergies  Prescriptions Prior to Admission  Medication Sig Dispense Refill Last Dose  . acetaminophen (TYLENOL) 325 MG tablet Take 650 mg by mouth every 6 (six) hours as needed for mild pain or headache.    Past Month at Unknown time  . famotidine (PEPCID) 20 MG tablet Take 1 tablet (20 mg total) by mouth 2 (two) times daily. (Patient taking differently: Take 20 mg by mouth 2 (two) times daily as needed for heartburn. ) 60 tablet 3 Past Week at Unknown time  .  Prenatal Vit-Fe Fumarate-FA (PRENATAL VITAMIN PO) Take 1 tablet by mouth daily.    02/06/2017 at Unknown time  . valACYclovir (VALTREX) 500 MG tablet Take two tablets by mouth twice daily for ten days; then one tablet twice daily for remainder of pregnancy (Patient taking differently: Take 500 mg by mouth 2 (two) times daily. ) 180 tablet 5 02/06/2017 at Unknown time  . terconazole (TERAZOL 3) 0.8 % vaginal cream Place 1 applicator vaginally at bedtime. (Patient not taking: Reported on 01/15/2017) 20 g 0 Not Taking at Unknown time     Review of Systems   All systems reviewed and negative except as stated in HPI  Blood pressure (!) 142/83, pulse 90, temperature 98.5 F (36.9 C), temperature source Oral, resp. rate 18, weight 258 lb (117 kg), last menstrual period 05/08/2016. General appearance: alert, cooperative and appears stated age Lungs: no respiratory distress. Heart: regular rate and rhythm Abdomen: soft, non-tender; bowel sounds normal Extremities: No calf swelling or tenderness Presentation: cephalic by my exam Fetal monitoring: category 1 Uterine activity: contractions about every 3 minutes Dilation: 4.5 Effacement (%): 50 Station: -3 Exam by:: Genevie Ann   Prenatal labs: ABO, Rh: O/NEG/-- (11/01 1615) Antibody: Negative (02/26 1148)  Rubella: immune RPR: Non Reactive (02/26 0915)  HBsAg: NEGATIVE (11/01 1615)  HIV: Non Reactive (02/26 0915)  GBS:   unknown - PCR pending 2 hr Glucola: normal Genetic screening:  Quad negative Anatomy US: normal  Prenatal Transfer Tool  Maternal Diabetes: No Genetic Screening: Normal Maternal Ultrasounds/Referrals: Normal Fetal Ultrasounds or other Referrals:  None Maternal Substance Abuse:  No Significant Maternal Medications:  None Significant Maternal Lab Results: Lab values include: Other:  GBS unknown  Results for orders placed or performed during the hospital encounter of 02/06/17 (from the past 24 hour(s))  Urinalysis, Routine w  reflex microscopic   Collection Time: 02/06/17  3:00 PM  Result Value Ref Range   Color, Urine YELLOW YELLOW   APPearance HAZY (A) CLEAR   Specific Gravity, Urine 1.025 1.005 - 1.030   pH 6.0 5.0 - 8.0   Glucose, UA NEGATIVE NEGATIVE mg/dL   Hgb urine dipstick MODERATE (A) NEGATIVE   Bilirubin Urine NEGATIVE NEGATIVE   Ketones, ur 20 (A) NEGATIVE mg/dL   Protein, ur NEGATIVE NEGATIVE mg/dL   Nitrite NEGATIVE NEGATIVE   Leukocytes, UA MODERATE (A) NEGATIVE   RBC / HPF 0-5 0 - 5 RBC/hpf   WBC, UA TOO NUMEROUS TO COUNT 0 - 5 WBC/hpf   Bacteria, UA RARE (A) NONE SEEN   Squamous Epithelial / LPF 6-30 (A) NONE SEEN   Mucous PRESENT   Fern Test   Collection Time: 02/06/17  3:33 PM  Result Value Ref Range   POCT Fern Test Positive = ruptured amniotic membanes   CBC   Collection Time: 02/06/17  3:55 PM  Result Value Ref Range   WBC 8.5 4.0 - 10.5 K/uL   RBC 3.91 3.87 - 5.11 MIL/uL   Hemoglobin 10.5 (L) 12.0 - 15.0 g/dL   HCT 16.132.1 (L) 09.636.0 - 04.546.0 %   MCV 82.1 78.0 - 100.0 fL   MCH 26.9 26.0 - 34.0 pg   MCHC 32.7 30.0 - 36.0 g/dL   RDW 40.914.3 81.111.5 - 91.415.5 %   Platelets 250 150 - 400 K/uL    Patient Active Problem List   Diagnosis Date Noted  . Normal labor 02/06/2017  . 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 Maternal morbid obesity, antepartum (HCC) 10/01/2016  . Rh negative state in antepartum period 08/08/2016  . Supervision of other normal pregnancy, antepartum 08/06/2016  . Group B streptococcus urinary tract infection affecting pregnancy 07/28/2016  . Prehypertension 07/04/2016  . Chronic GERD 03/26/2016    Assessment: Kristine Cordova is a 26 y.o. G2P1001 at 3245w0d here for SROM and spontaneous onset of labor  #Labor:expectant mangment at this time. No other complaints #Pain: Plans for peidural #FWB: Category 1 #ID:  GBS unknown PCR pending #MOF: breast and bottle #MOC:possibly an IUD #Circ:  Yes as an  outpatient #elevated BP in labor: CMP and CBC normal. PR/CR ratio pending. All mild range. likley will meet cirteria for gHTN. Will await pr/cr ratio and serial BPs  Kristine Cordova 02/06/2017, 4:57 PM

## 2017-02-06 NOTE — MAU Note (Signed)
Pt presents to MAU stating that she went to the doctor this morning and she was 5 cms. She states she has noticed leakage of fluid today. Lower abdominal cramping

## 2017-02-06 NOTE — Progress Notes (Signed)
Ctx still very irregular and not painful.   Vitals:   02/06/17 1843 02/06/17 1937  BP: 127/78 133/82  Pulse: 93 84  Resp: 18 18  Temp:  98.4 F (36.9 C)  . FHR Cat 1.  Will augment w/pitocin d/t PPROM X 24 hours

## 2017-02-06 NOTE — Anesthesia Pain Management Evaluation Note (Signed)
  [facility] Pain Management Visit Note  Patient: Kristine SlaterJessica Cordova, 26 y.o., female  "Hello I am a [facility] of the anesthesia [facility] at [facility]. We have an anesthesia team available at all times to provide care throughout the hospital, including epidural management and anesthesia for C-section. I don't know your plan for the delivery whether it a natural birth, water birth, IV sedation, nitrous supplementation, doula or epidural, but we want to meet your pain goals."   1.Was your pain managed to your expectations on prior hospitalizations?   Yes   2.What is your expectation for pain management during this hospitalization?     Epidural  3.How can we help you reach that goal? unsure  Record the patient's initial score and the patient's pain goal.   Pain: 3  Pain Goal: 4 [facility] wants you to be able to say your pain was always managed very well.  Kristine ShellingBURGER,Kristine Cordova 02/06/2017

## 2017-02-06 NOTE — Anesthesia Preprocedure Evaluation (Signed)
Anesthesia Evaluation  Patient identified by MRN, date of birth, ID band Patient awake    Reviewed: Allergy & Precautions, NPO status , Patient's Chart, lab work & pertinent test results  History of Anesthesia Complications Negative for: history of anesthetic complications  Airway Mallampati: I  TM Distance: >3 FB Neck ROM: Full    Dental  (+) Dental Advisory Given   Pulmonary former smoker,    breath sounds clear to auscultation       Cardiovascular hypertension (no meds),  Rhythm:Regular Rate:Normal     Neuro/Psych negative neurological ROS     GI/Hepatic Neg liver ROS, GERD  Poorly Controlled,  Endo/Other  Morbid obesity  Renal/GU negative Renal ROS     Musculoskeletal   Abdominal (+) + obese,   Peds  Hematology Hb 10.0, plt 250K   Anesthesia Other Findings   Reproductive/Obstetrics (+) Pregnancy                             Anesthesia Physical Anesthesia Plan  ASA: III  Anesthesia Plan: Epidural   Post-op Pain Management:    Induction:   Airway Management Planned: Natural Airway  Additional Equipment:   Intra-op Plan:   Post-operative Plan:   Informed Consent: I have reviewed the patients History and Physical, chart, labs and discussed the procedure including the risks, benefits and alternatives for the proposed anesthesia with the patient or authorized representative who has indicated his/her understanding and acceptance.   Dental advisory given  Plan Discussed with:   Anesthesia Plan Comments: (Patient identified. Risks/Benefits/Options discussed with patient including but not limited to bleeding, infection, nerve damage, paralysis, failed block, incomplete pain control, headache, blood pressure changes, nausea, vomiting, reactions to medication both or allergic, itching and postpartum back pain. Confirmed with bedside nurse the patient's most recent platelet count.  Confirmed with patient that they are not currently taking any anticoagulation, have any bleeding history or any family history of bleeding disorders. Patient expressed understanding and wished to proceed. All questions were answered. )        Anesthesia Quick Evaluation

## 2017-02-07 ENCOUNTER — Encounter (HOSPITAL_COMMUNITY): Payer: Self-pay | Admitting: General Practice

## 2017-02-07 DIAGNOSIS — Z3A38 38 weeks gestation of pregnancy: Secondary | ICD-10-CM

## 2017-02-07 LAB — RPR: RPR Ser Ql: NONREACTIVE

## 2017-02-07 MED ORDER — DIPHENHYDRAMINE HCL 25 MG PO CAPS: 25.0000 mg | ORAL_CAPSULE | Freq: Four times a day (QID) | ORAL | Status: DC | PRN

## 2017-02-07 MED ORDER — METHYLERGONOVINE MALEATE 0.2 MG PO TABS: 0.2000 mg | ORAL_TABLET | ORAL | Status: DC | PRN

## 2017-02-07 MED ORDER — PRENATAL MULTIVITAMIN CH
1.0000 | ORAL_TABLET | Freq: Every day | ORAL | Status: DC
  Administered 2017-02-07 – 2017-02-08 (×2): 1 via ORAL
  Filled 2017-02-07 (×2): qty 1

## 2017-02-07 MED ORDER — ONDANSETRON HCL 4 MG PO TABS: 4.0000 mg | ORAL_TABLET | ORAL | Status: DC | PRN

## 2017-02-07 MED ORDER — METHYLERGONOVINE MALEATE 0.2 MG/ML IJ SOLN: 0.2000 mg | INTRAMUSCULAR | Status: DC | PRN

## 2017-02-07 MED ORDER — FERROUS SULFATE 325 (65 FE) MG PO TABS
325.0000 mg | ORAL_TABLET | Freq: Two times a day (BID) | ORAL | Status: DC
  Administered 2017-02-07 – 2017-02-08 (×3): 325 mg via ORAL
  Filled 2017-02-07 (×3): qty 1

## 2017-02-07 MED ORDER — WITCH HAZEL-GLYCERIN EX PADS: 1.0000 "application " | MEDICATED_PAD | CUTANEOUS | Status: DC | PRN

## 2017-02-07 MED ORDER — ZOLPIDEM TARTRATE 5 MG PO TABS: 5.0000 mg | ORAL_TABLET | Freq: Every evening | ORAL | Status: DC | PRN

## 2017-02-07 MED ORDER — RHO D IMMUNE GLOBULIN 1500 UNIT/2ML IJ SOSY
300.0000 ug | PREFILLED_SYRINGE | Freq: Once | INTRAMUSCULAR | Status: AC
  Administered 2017-02-07: 300 ug via INTRAVENOUS
  Filled 2017-02-07: qty 2

## 2017-02-07 MED ORDER — ACETAMINOPHEN 325 MG PO TABS
650.0000 mg | ORAL_TABLET | ORAL | Status: DC | PRN
  Administered 2017-02-07 (×2): 650 mg via ORAL
  Filled 2017-02-07 (×2): qty 2

## 2017-02-07 MED ORDER — DIBUCAINE 1 % RE OINT: 1.0000 "application " | TOPICAL_OINTMENT | RECTAL | Status: DC | PRN

## 2017-02-07 MED ORDER — DOCUSATE SODIUM 100 MG PO CAPS
100.0000 mg | ORAL_CAPSULE | Freq: Two times a day (BID) | ORAL | Status: DC
  Administered 2017-02-07 – 2017-02-08 (×2): 100 mg via ORAL
  Filled 2017-02-07 (×2): qty 1

## 2017-02-07 MED ORDER — IBUPROFEN 600 MG PO TABS
600.0000 mg | ORAL_TABLET | Freq: Four times a day (QID) | ORAL | Status: DC
  Administered 2017-02-07 – 2017-02-08 (×5): 600 mg via ORAL
  Filled 2017-02-07 (×5): qty 1

## 2017-02-07 MED ORDER — SIMETHICONE 80 MG PO CHEW: 80.0000 mg | CHEWABLE_TABLET | ORAL | Status: DC | PRN

## 2017-02-07 MED ORDER — BENZOCAINE-MENTHOL 20-0.5 % EX AERO
1.0000 "application " | INHALATION_SPRAY | CUTANEOUS | Status: DC | PRN
  Administered 2017-02-07: 1 via TOPICAL
  Filled 2017-02-07: qty 56

## 2017-02-07 MED ORDER — BISACODYL 10 MG RE SUPP: 10.0000 mg | Freq: Every day | RECTAL | Status: DC | PRN

## 2017-02-07 MED ORDER — OXYCODONE HCL 5 MG PO TABS: 5.0000 mg | ORAL_TABLET | ORAL | Status: DC | PRN

## 2017-02-07 MED ORDER — FLEET ENEMA 7-19 GM/118ML RE ENEM: 1.0000 | ENEMA | Freq: Every day | RECTAL | Status: DC | PRN

## 2017-02-07 MED ORDER — MEASLES, MUMPS & RUBELLA VAC ~~LOC~~ INJ
0.5000 mL | INJECTION | Freq: Once | SUBCUTANEOUS | Status: DC
  Filled 2017-02-07: qty 0.5

## 2017-02-07 MED ORDER — OXYCODONE HCL 5 MG PO TABS: 10.0000 mg | ORAL_TABLET | ORAL | Status: DC | PRN

## 2017-02-07 MED ORDER — ONDANSETRON HCL 4 MG/2ML IJ SOLN: 4.0000 mg | INTRAMUSCULAR | Status: DC | PRN

## 2017-02-07 MED ORDER — TETANUS-DIPHTH-ACELL PERTUSSIS 5-2.5-18.5 LF-MCG/0.5 IM SUSP
0.5000 mL | Freq: Once | INTRAMUSCULAR | Status: AC
Start: 2017-02-08 — End: 2017-02-08
  Administered 2017-02-08: 0.5 mL via INTRAMUSCULAR
  Filled 2017-02-07: qty 0.5

## 2017-02-07 MED ORDER — COCONUT OIL OIL
1.0000 "application " | TOPICAL_OIL | Status: DC | PRN
  Administered 2017-02-08: 1 via TOPICAL
  Filled 2017-02-07: qty 120

## 2017-02-07 NOTE — Anesthesia Postprocedure Evaluation (Addendum)
Anesthesia Post Note  Patient: Kristine SlaterJessica Cordova  Procedure(s) Performed: * No procedures listed *  Patient location during evaluation: Mother Baby Anesthesia Type: Epidural Level of consciousness: awake and alert, oriented and patient cooperative Pain management: pain level controlled Vital Signs Assessment: post-procedure vital signs reviewed and stable Respiratory status: spontaneous breathing Cardiovascular status: stable Postop Assessment: no headache, epidural receding, patient able to bend at knees and no signs of nausea or vomiting Anesthetic complications: no Comments: Pain score 4.        Last Vitals:  Vitals:   02/07/17 0345 02/07/17 0705  BP: (!) 102/59 114/60  Pulse: 79 75  Resp: 18 18  Temp: 36.6 C 36.5 C    Last Pain:  Vitals:   02/07/17 0754  TempSrc:   PainSc: 4    Pain Goal: Patients Stated Pain Goal: 2 (02/06/17 1653)               Merrilyn PumaWRINKLE,DANA

## 2017-02-07 NOTE — Lactation Note (Signed)
This note was copied from a baby's chart. Lactation Consultation Note  Patient Name: Kristine Cephus SlaterJessica Korte GNFAO'ZToday's Date: 02/07/2017 Reason for consult: Follow-up assessment;Difficult latch Stratus interpreter [id number] Robbie LouisGaby used for visit. Mom reports baby not latching well, using 24 nipple shield. LC noted cracking on left nipple, changed NS to 20 for better fit and assisted Mom with latching baby in laid back position. Took few attempts for baby to organize his suck but once he did, demonstrated good suckling bursts with some swallows noted. Mom denied discomfort, colostrum present in nipple shield. Mom was able to demonstrate applying nipple shield and latched baby to right breast with minimal assist. DEBP set up for Mom to start pumping every 3 hours for 15 minutes till baby latching consistently. Advised Mom to BF with feeding ques, 8-12 times in 24 hours. Keep baby nursing for 15-20 minutes both breasts some feedings. Care for sore nipples reviewed, advised to apply EBM, comfort gels given with instructions. Encouraged Mom to call for assist as needed with latch or for questions/concerns.   Maternal Data Has patient been taught Hand Expression?: Yes Does the patient have breastfeeding experience prior to this delivery?: Yes  Feeding Feeding Type: Breast Fed Length of feed: 13 min  LATCH Score/Interventions Latch: Grasps breast easily, tongue down, lips flanged, rhythmical sucking. (using 20 nipple shield) Intervention(s): Adjust position;Assist with latch  Audible Swallowing: A few with stimulation  Type of Nipple: Flat Intervention(s): Shells;Double electric pump  Comfort (Breast/Nipple): Engorged, cracked, bleeding, large blisters, severe discomfort Problem noted: Cracked, bleeding, blisters, bruises Intervention(s): Expressed breast milk to nipple;Other (comment) (comfort gels)     Hold (Positioning): Assistance needed to correctly position infant at breast and maintain  latch. Intervention(s): Breastfeeding basics reviewed;Support Pillows;Position options;Skin to skin  LATCH Score: 5  Lactation Tools Discussed/Used Tools: Shells;Nipple Dorris CarnesShields;Pump Nipple shield size: 20;24 Shell Type: Inverted Breast pump type: Double-Electric Breast Pump WIC Program: Yes   Consult Status Consult Status: Follow-up Date: 02/08/17 Follow-up type: In-patient    Alfred LevinsGranger, Lizzet Hendley Ann 02/07/2017, 4:08 PM

## 2017-02-08 LAB — RH IG WORKUP (INCLUDES ABO/RH)
ABO/RH(D): O NEG
Fetal Screen: NEGATIVE
Gestational Age(Wks): 38.1
Unit division: 0

## 2017-02-08 MED ORDER — POLYETHYLENE GLYCOL 3350 17 G PO PACK
17.0000 g | PACK | Freq: Every day | ORAL | Status: DC
  Administered 2017-02-08: 17 g via ORAL
  Filled 2017-02-08 (×2): qty 1

## 2017-02-08 MED ORDER — IBUPROFEN 600 MG PO TABS: 600.0000 mg | ORAL_TABLET | Freq: Four times a day (QID) | ORAL | Status: DC | PRN

## 2017-02-08 NOTE — Lactation Note (Signed)
This note was copied from a baby's chart. Lactation Consultation Note  Patient Name: Kristine Cordova Date: 02/08/2017 Reason for consult: Follow-up assessment Baby at 36 hr of life and dyad set for D/C today. Mom is using a NS with every feeding. She is reporting bilateral nipple soreness. She has flat nipples, no skin break down was noted on the nipple but there was a light pink ring where the base of NS comes in contact with the areola. Given coconut oil. Discussed baby behavior, feeding frequency, pumping, baby belly size, voids, wt loss, breast changes, and nipple care. Reviewed manual pump (the one in the DEBP kit) and Harmony. Parents are aware of lactation services and support group. Made an OP apt for 02/13/17. Parents will call as needed.    Maternal Data    Feeding    LATCH Score/Interventions                      Lactation Tools Discussed/Used Pump Review: Setup, frequency, and cleaning;Milk Storage Initiated by:: ES Date initiated:: 02/08/17   Consult Status Consult Status: Complete Follow-up type: Call as needed    Denzil Hughes 02/08/2017, 12:22 PM

## 2017-02-08 NOTE — Discharge Summary (Signed)
OB Discharge Summary     Patient Name: Kristine Cordova DOB: June 22, 1991 MRN: [id number]  Date of admission: 02/06/2017 Delivering MD: Jacklyn Shell   Date of discharge: 02/08/2017  Admitting diagnosis: 37WKS,LABOR Intrauterine pregnancy: [redacted]w[redacted]d     Secondary diagnosis:  Active Problems:   Normal labor  Additional problems: Hypertension, diverticulitis     Discharge diagnosis: Term Pregnancy Delivered                                                                                                Post partum procedures:none  Augmentation: Pitocin  Complications: None  Hospital course:  Onset of Labor With Vaginal Delivery     26 y.o. yo [id number] at [redacted]w[redacted]d was admitted in Active Labor on 02/06/2017. Patient had an uncomplicated labor course as follows:  Membrane Rupture Time/Date: 9:25 PM ,02/06/2017   Intrapartum Procedures: Episiotomy: None [1]                                         Lacerations:  None [1]  Patient had a delivery of a Viable infant. 02/07/2017  Information for the patient's newborn:  Dacy, Enrico [[id number]]  Delivery Method: Vaginal, Spontaneous Delivery (Filed from Delivery Summary)    Pateint had an uncomplicated postpartum course.  She is ambulating, tolerating a regular diet, passing flatus, and urinating well. Patient is discharged home in stable condition on 02/08/17.   Physical exam  Vitals:   02/07/17 0215 02/07/17 0345 02/07/17 0705 02/08/17 0620  BP: (!) 110/53 (!) 102/59 114/60 (!) 108/48  Pulse: 79 79 75 64  Resp: 18 18 18 18   Temp: 98 F (36.7 C) 97.9 F (36.6 C) 97.7 F (36.5 C) 98.1 F (36.7 C)  TempSrc: Oral Oral Oral Oral  SpO2:      Weight:      Height:       General: alert, cooperative and no distress Lochia: appropriate Uterine Fundus: firm Incision: N/A DVT Evaluation: No evidence of DVT seen on physical exam. Labs: Lab Results  Component Value Date   WBC 8.5 02/06/2017   HGB 10.5 (L) 02/06/2017   HCT 32.1  (L) 02/06/2017   MCV 82.1 02/06/2017   PLT 250 02/06/2017   CMP Latest Ref Rng & Units 02/06/2017  Glucose 65 - 99 mg/dL 130(Q)  BUN 6 - 20 mg/dL 10  Creatinine 6.57 - 8.46 mg/dL 9.62  Sodium 952 - 841 mmol/L 134(L)  Potassium 3.5 - 5.1 mmol/L 3.7  Chloride 101 - 111 mmol/L 106  CO2 22 - 32 mmol/L 19(L)  Calcium 8.9 - 10.3 mg/dL 3.2(G)  Total Protein 6.5 - 8.1 g/dL 6.5  Total Bilirubin 0.3 - 1.2 mg/dL 0.5  Alkaline Phos 38 - 126 U/L 190(H)  AST 15 - 41 U/L 24  ALT 14 - 54 U/L 30    Discharge instruction: per After Visit Summary and "Baby and Me Booklet".  After visit meds:  Allergies as of 02/08/2017   No Known Allergies     Medication  List    STOP taking these medications   terconazole 0.8 % vaginal cream Commonly known as:  TERAZOL 3   valACYclovir 500 MG tablet Commonly known as:  VALTREX     TAKE these medications   acetaminophen 325 MG tablet Commonly known as:  TYLENOL Take 650 mg by mouth every 6 (six) hours as needed for mild pain or headache.   famotidine 20 MG tablet Commonly known as:  PEPCID Take 1 tablet (20 mg total) by mouth 2 (two) times daily. What changed:  when to take this  reasons to take this   PRENATAL VITAMIN PO Take 1 tablet by mouth daily.       Diet: routine diet  Activity: Advance as tolerated. Pelvic rest for 6 weeks.   Outpatient follow up:6 weeks Follow up Appt:Future Appointments Date Time Provider Department Center  02/13/2017 10:15 AM Levie HeritageStinson, Jacob J, DO [facility] None   Follow up Visit:  [facility]  Postpartum contraception: Undecided; Given information in Spanish  Newborn Data: Live born female  Birth Weight: 9 lb 5.2 oz (4230 g) APGAR: 9, 9  Baby Feeding: Breast Disposition:home with mother   02/08/2017 Elsie LincolnKelly Wendelin Bradt, MD

## 2017-02-13 ENCOUNTER — Encounter: Payer: Medicaid Other | Admitting: Family Medicine

## 2017-02-13 ENCOUNTER — Ambulatory Visit (HOSPITAL_COMMUNITY): Payer: Medicaid Other

## 2017-03-16 ENCOUNTER — Encounter: Payer: Self-pay | Admitting: Obstetrics & Gynecology

## 2017-03-16 ENCOUNTER — Ambulatory Visit (INDEPENDENT_AMBULATORY_CARE_PROVIDER_SITE_OTHER): Payer: Medicaid Other | Admitting: Obstetrics & Gynecology

## 2017-03-16 ENCOUNTER — Other Ambulatory Visit (HOSPITAL_COMMUNITY)
Admission: RE | Admit: 2017-03-16 | Discharge: 2017-03-16 | Disposition: A | Discharge status: Home / Self Care | Payer: Medicaid Other | Source: Ambulatory Visit | Attending: Obstetrics & Gynecology | Admitting: Obstetrics & Gynecology

## 2017-03-16 VITALS — BP 119/68 | HR 113 | Ht 64.0 in | Wt 225.0 lb

## 2017-03-16 DIAGNOSIS — Z0000 Encounter for general adult medical examination without abnormal findings: Secondary | ICD-10-CM | POA: Diagnosis present

## 2017-03-16 DIAGNOSIS — Z3480 Encounter for supervision of other normal pregnancy, unspecified trimester: Secondary | ICD-10-CM

## 2017-03-16 NOTE — Progress Notes (Signed)
Post Partum Exam  Kristine Cordova is a 26 y.o. 812P2002 female who presents for a postpartum visit. She is 5 weeks postpartum following a spontaneous vaginal delivery. I have fully reviewed the prenatal and intrapartum course. The delivery was at 38 gestational weeks.  Anesthesia: epidural. Postpartum course has been unremarkable. Baby's course has been unremarkable other than getting an infection and being hospitalized for two nights two weeks ago. Baby is feeding by both breast and bottle - Similac Advance. Bleeding thin lochia. Bowel function is normal. Bladder function is abnormal: because of constipation.. Patient is not sexually active. Contraception method is none. Postpartum depression screening:positive (score 17)  The following portions of the patient's history were reviewed and updated as appropriate: allergies, current medications, past family history, past medical history, past social history, past surgical history and problem list.  Review of Systems Pertinent items are noted in HPI.    Objective:  Height 5\' 4"  (1.626 m), weight 225 lb (102.1 kg), unknown if currently breastfeeding.  General:  alert   Breasts:  inspection negative, no nipple discharge or bleeding, no masses or nodularity palpable  Lungs: clear to auscultation bilaterally  Heart:  regular rate and rhythm, S1, S2 normal, no murmur, click, rub or gallop  Abdomen: soft, non-tender; bowel sounds normal; no masses,  no organomegaly, obese, many scars   Vulva:  normal  Vagina: normal vagina  Cervix:  anteverted  Corpus: normal  Adnexa:  normal adnexa  Rectal Exam: Not performed.        Assessment:    Normal  postpartum exam. Pap smear done at today's visit.   Plan:   1. Contraception: tubal ligation to be scheduled in 30 days (will sign medicaid forms today) 2. High score on the depression test- [facility]

## 2017-03-17 ENCOUNTER — Encounter (HOSPITAL_COMMUNITY): Payer: Self-pay

## 2017-03-17 LAB — CYTOLOGY - PAP: DIAGNOSIS: NEGATIVE

## 2017-04-06 ENCOUNTER — Institutional Professional Consult (permissible substitution): Payer: Medicaid Other

## 2017-04-06 NOTE — BH Specialist Note (Deleted)
Integrated Behavioral Health Initial Visit  MRN: [id number] Name: Kristine Cordova   Session Start time: *** Session End time: *** Total time: {IBH Total Time:21014050}  Type of Service: Integrated Behavioral Health- Individual/Family Interpretor:Yes.   Interpretor Name and Language: ***, Spanish   Warm Hand Off Completed.       SUBJECTIVE: Kristine Cordova is a 26 y.o. female accompanied by {Persons; PED relatives w/patient:19415}. Patient was referred by Dr Marice Potterove for depression Patient reports the following symptoms/concerns: *** Duration of problem: ***; Severity of problem: {Mild/Moderate/Severe:20260}  OBJECTIVE: Mood: {BHH MOOD:22306} and Affect: {BHH AFFECT:22307} Risk of harm to self or others: {CHL AMB BH Suicide Current Mental Status:21022748}   LIFE CONTEXT: Family and Social: *** School/Work: *** Self-Care: *** Life Changes: Recent childbirth ***  GOALS ADDRESSED: Patient will reduce symptoms of: {IBH Symptoms:21014056} and increase knowledge and/or ability of: {IBH Patient Tools:21014057} and also: {IBH Goals:21014053}   INTERVENTIONS: {IBH Interventions:21014054}  Standardized Assessments completed: GAD-7 and PHQ 9  ASSESSMENT: Patient currently experiencing ***. Patient may benefit from psychoeducation and brief therapeutic intervention regarding coping with ***  PLAN: 1. Follow up with behavioral health clinician on : *** 2. Behavioral recommendations: *** 3. Referral(s): {IBH Referrals:21014055} 4. "From scale of 1-10, how likely are you to follow plan?": ***  Kristine Cordova, LCSWA

## 2017-04-14 NOTE — Patient Instructions (Signed)
Your procedure is scheduled on:  Thursday, April 23, 2017  [facility] through the [facility] at:  8:30 AM  Pick up the phone at the desk and dial [phone].  Call this number if you have problems the morning of surgery: [phone].  Remember: Do NOT eat food or drink after:  Midnight Wednesday  Take these medicines the morning of surgery with a SIP OF WATER:  None  Stop ALL herbal medications at this time  Do NOT smoke the day of surgery.  Do NOT wear jewelry (body piercing), metal hair clips/bobby pins, make-up, artifical eyelashes or nail polish. Do NOT wear lotions, powders, or perfumes.  You may wear deodorant. Do NOT shave for 48 hours prior to surgery. Do NOT bring valuables to the hospital. Contacts, dentures, or bridgework may not be worn into surgery.  Have a responsible adult drive you home and stay with you for 24 hours after your procedure  Bring a copy of your healthcare power of attorney and living will documents.  Instrucciones:  Su cirugia esta programada para-( your procedure is scheduled on) : Thursday, April 23, 2017   Entre por la entrada principal a la(s) -(enter through the main entrance at): 8:30 AM  [address],  [place] el [phone] e informenos de su llegada ( pick up phone, dial [phone] on arrival)  Por favor llame al [phone] si tiene algun problema la [place] de cirugia ( please call [phone] if you have any problems the morning of surgery.)  Recuerde: (Remember)  No coma alimentos ni tome liquidos, incluyendo agua, despues de la medianoche del  ( Do not eat food or drink liquids including water after midnight on Midnight Wednesday  Tome estas medicinas la [place] de la cirugia con un sorbito de agua (take these meds the morning of surgery with a SIP of water)  None  Puede cepillarse los dientes en la manana de la [place]. (you may brush your teeth the morning of surgery)  NO use joyas, maquillaje de ojos, lapiz labial,  crema para el cuerpo o esmalte de unas oscuro - las unas de los pies pueden estar pintados. ( Do not wear jewelry, eye makeup, lipstick, body lotion, or dark fingernail polish)  Puede usar desodorante ( you may wear deodorant)  Si va a ser ingresado despues de las [place], deje la [place] en el carro hasta que se le haya asignado una habitacion. ( If you are to be admitted after surgery, leave suitcase in car until your room has been assigned.)  A los pacientes que se les de de alta el mismo dia no se les permitira manejar a casa.  ( Patients discharged on the day of surgery will not be allowed to drive home)  Use ropa suelta y comoda de regreso a Technical sales engineercaso. ( wear loose comfortable clothes for ride home)  Firma del paciente (patient signature) ______________________________________

## 2017-04-15 ENCOUNTER — Encounter (HOSPITAL_COMMUNITY)
Admission: RE | Admit: 2017-04-15 | Discharge: 2017-04-15 | Disposition: A | Discharge status: Home / Self Care | Payer: Medicaid Other | Source: Ambulatory Visit | Attending: Obstetrics & Gynecology | Admitting: Obstetrics & Gynecology

## 2017-04-15 ENCOUNTER — Encounter (HOSPITAL_COMMUNITY): Payer: Self-pay

## 2017-04-15 DIAGNOSIS — Z01818 Encounter for other preprocedural examination: Secondary | ICD-10-CM | POA: Insufficient documentation

## 2017-04-15 HISTORY — DX: Gastro-esophageal reflux disease without esophagitis: K21.9

## 2017-04-15 LAB — CBC
HEMATOCRIT: 37.8 % (ref 36.0–46.0)
Hemoglobin: 12.5 g/dL (ref 12.0–15.0)
MCH: 27.7 pg (ref 26.0–34.0)
MCHC: 33.1 g/dL (ref 30.0–36.0)
MCV: 83.6 fL (ref 78.0–100.0)
Platelets: 287 10*3/uL (ref 150–400)
RBC: 4.52 MIL/uL (ref 3.87–5.11)
RDW: 15.4 % (ref 11.5–15.5)
WBC: 8 10*3/uL (ref 4.0–10.5)

## 2017-04-22 NOTE — Anesthesia Preprocedure Evaluation (Signed)
Anesthesia Evaluation  Patient identified by MRN, date of birth, ID band Patient awake    Reviewed: Allergy & Precautions, NPO status , Patient's Chart, lab work & pertinent test results  History of Anesthesia Complications Negative for: history of anesthetic complications  Airway Mallampati: I  TM Distance: >3 FB Neck ROM: Full    Dental  (+) Dental Advisory Given   Pulmonary former smoker,    breath sounds clear to auscultation       Cardiovascular hypertension,  Rhythm:Regular Rate:Normal     Neuro/Psych negative neurological ROS     GI/Hepatic Neg liver ROS, GERD  Poorly Controlled,  Endo/Other  Morbid obesity  Renal/GU negative Renal ROS     Musculoskeletal   Abdominal (+) + obese,   Peds  Hematology Hb 10.0, plt 250K   Anesthesia Other Findings   Reproductive/Obstetrics (+) Pregnancy                             Anesthesia Physical  Anesthesia Plan  ASA: III  Anesthesia Plan: General   Post-op Pain Management:    Induction: Intravenous  PONV Risk Score and Plan: 2 and Ondansetron, Dexamethasone, Propofol and Treatment may vary due to age or medical condition  Airway Management Planned: LMA  Additional Equipment:   Intra-op Plan:   Post-operative Plan:   Informed Consent:   Dental advisory given  Plan Discussed with: CRNA and Surgeon  Anesthesia Plan Comments:         Anesthesia Quick Evaluation

## 2017-04-23 ENCOUNTER — Encounter (HOSPITAL_COMMUNITY)
Admission: RE | Disposition: A | Discharge status: Home / Self Care | Payer: Self-pay | Source: Ambulatory Visit | Attending: Obstetrics & Gynecology

## 2017-04-23 ENCOUNTER — Ambulatory Visit: Payer: Medicaid Other | Admitting: Obstetrics & Gynecology

## 2017-04-23 ENCOUNTER — Ambulatory Visit (HOSPITAL_COMMUNITY)
Admission: RE | Admit: 2017-04-23 | Discharge: 2017-04-23 | Disposition: A | Discharge status: Home / Self Care | Payer: Medicaid Other | Source: Ambulatory Visit | Attending: Obstetrics & Gynecology | Admitting: Obstetrics & Gynecology

## 2017-04-23 ENCOUNTER — Ambulatory Visit (HOSPITAL_COMMUNITY): Payer: Medicaid Other | Admitting: Anesthesiology

## 2017-04-23 ENCOUNTER — Encounter (HOSPITAL_COMMUNITY): Payer: Self-pay

## 2017-04-23 DIAGNOSIS — Z87891 Personal history of nicotine dependence: Secondary | ICD-10-CM | POA: Diagnosis not present

## 2017-04-23 DIAGNOSIS — K219 Gastro-esophageal reflux disease without esophagitis: Secondary | ICD-10-CM | POA: Insufficient documentation

## 2017-04-23 DIAGNOSIS — Z79899 Other long term (current) drug therapy: Secondary | ICD-10-CM | POA: Diagnosis not present

## 2017-04-23 DIAGNOSIS — Z9049 Acquired absence of other specified parts of digestive tract: Secondary | ICD-10-CM | POA: Diagnosis not present

## 2017-04-23 DIAGNOSIS — Z90721 Acquired absence of ovaries, unilateral: Secondary | ICD-10-CM | POA: Diagnosis not present

## 2017-04-23 DIAGNOSIS — I10 Essential (primary) hypertension: Secondary | ICD-10-CM | POA: Insufficient documentation

## 2017-04-23 DIAGNOSIS — Z302 Encounter for sterilization: Secondary | ICD-10-CM | POA: Insufficient documentation

## 2017-04-23 DIAGNOSIS — Z833 Family history of diabetes mellitus: Secondary | ICD-10-CM | POA: Insufficient documentation

## 2017-04-23 DIAGNOSIS — Z809 Family history of malignant neoplasm, unspecified: Secondary | ICD-10-CM | POA: Diagnosis not present

## 2017-04-23 HISTORY — PX: LAPAROSCOPIC TUBAL LIGATION: SHX1937

## 2017-04-23 LAB — PREGNANCY, URINE: Preg Test, Ur: NEGATIVE

## 2017-04-23 SURGERY — LIGATION, FALLOPIAN TUBE, LAPAROSCOPIC
Anesthesia: General | Site: Abdomen | Laterality: Left

## 2017-04-23 MED ORDER — KETOROLAC TROMETHAMINE 30 MG/ML IJ SOLN
INTRAMUSCULAR | Status: DC | PRN
  Administered 2017-04-23: 30 mg via INTRAVENOUS

## 2017-04-23 MED ORDER — SCOPOLAMINE 1 MG/3DAYS TD PT72
MEDICATED_PATCH | TRANSDERMAL | Status: AC
  Administered 2017-04-23: 1.5 mg via TRANSDERMAL
  Filled 2017-04-23: qty 1

## 2017-04-23 MED ORDER — FENTANYL CITRATE (PF) 100 MCG/2ML IJ SOLN
INTRAMUSCULAR | Status: DC | PRN
  Administered 2017-04-23 (×3): 50 ug via INTRAVENOUS

## 2017-04-23 MED ORDER — SUGAMMADEX SODIUM 200 MG/2ML IV SOLN
INTRAVENOUS | Status: DC | PRN
  Administered 2017-04-23: 200 mg via INTRAVENOUS

## 2017-04-23 MED ORDER — ROCURONIUM BROMIDE 100 MG/10ML IV SOLN
INTRAVENOUS | Status: DC | PRN
  Administered 2017-04-23: 35 mg via INTRAVENOUS

## 2017-04-23 MED ORDER — FENTANYL CITRATE (PF) 100 MCG/2ML IJ SOLN
INTRAMUSCULAR | Status: AC
  Filled 2017-04-23: qty 2

## 2017-04-23 MED ORDER — GLYCOPYRROLATE 0.2 MG/ML IJ SOLN
INTRAMUSCULAR | Status: DC | PRN
  Administered 2017-04-23: 0.2 mg via INTRAVENOUS

## 2017-04-23 MED ORDER — IBUPROFEN 600 MG PO TABS: 600.0000 mg | ORAL_TABLET | Freq: Four times a day (QID) | ORAL | 1 refills | Status: DC | PRN

## 2017-04-23 MED ORDER — ONDANSETRON HCL 4 MG/2ML IJ SOLN
INTRAMUSCULAR | Status: DC | PRN
  Administered 2017-04-23: 4 mg via INTRAVENOUS

## 2017-04-23 MED ORDER — ONDANSETRON HCL 4 MG/2ML IJ SOLN
INTRAMUSCULAR | Status: AC
  Filled 2017-04-23: qty 2

## 2017-04-23 MED ORDER — DEXAMETHASONE SODIUM PHOSPHATE 10 MG/ML IJ SOLN
INTRAMUSCULAR | Status: AC
  Filled 2017-04-23: qty 1

## 2017-04-23 MED ORDER — KETOROLAC TROMETHAMINE 30 MG/ML IJ SOLN
INTRAMUSCULAR | Status: AC
  Filled 2017-04-23: qty 1

## 2017-04-23 MED ORDER — LACTATED RINGERS IV SOLN
INTRAVENOUS | Status: DC
  Administered 2017-04-23 (×2): via INTRAVENOUS

## 2017-04-23 MED ORDER — SILVER NITRATE-POT NITRATE 75-25 % EX MISC
CUTANEOUS | Status: DC | PRN
  Administered 2017-04-23: 2 via TOPICAL

## 2017-04-23 MED ORDER — MIDAZOLAM HCL 2 MG/2ML IJ SOLN
INTRAMUSCULAR | Status: AC
  Filled 2017-04-23: qty 2

## 2017-04-23 MED ORDER — SCOPOLAMINE 1 MG/3DAYS TD PT72
1.0000 | MEDICATED_PATCH | Freq: Once | TRANSDERMAL | Status: DC
  Administered 2017-04-23: 1.5 mg via TRANSDERMAL

## 2017-04-23 MED ORDER — OXYCODONE-ACETAMINOPHEN 5-325 MG PO TABS: 1.0000 | ORAL_TABLET | Freq: Four times a day (QID) | ORAL | 0 refills | Status: DC | PRN

## 2017-04-23 MED ORDER — MIDAZOLAM HCL 2 MG/2ML IJ SOLN
INTRAMUSCULAR | Status: DC | PRN
  Administered 2017-04-23: 1 mg via INTRAVENOUS

## 2017-04-23 MED ORDER — ROCURONIUM BROMIDE 100 MG/10ML IV SOLN
INTRAVENOUS | Status: AC
Start: 2017-04-23 — End: ?
  Filled 2017-04-23: qty 1

## 2017-04-23 MED ORDER — SUGAMMADEX SODIUM 200 MG/2ML IV SOLN
INTRAVENOUS | Status: AC
  Filled 2017-04-23: qty 2

## 2017-04-23 MED ORDER — PROPOFOL 10 MG/ML IV BOLUS
INTRAVENOUS | Status: AC
  Filled 2017-04-23: qty 20

## 2017-04-23 MED ORDER — PROPOFOL 10 MG/ML IV BOLUS
INTRAVENOUS | Status: AC
  Filled 2017-04-23: qty 40

## 2017-04-23 MED ORDER — FENTANYL CITRATE (PF) 100 MCG/2ML IJ SOLN
25.0000 ug | INTRAMUSCULAR | Status: DC | PRN
  Administered 2017-04-23 (×3): 50 ug via INTRAVENOUS

## 2017-04-23 MED ORDER — MEPERIDINE HCL 25 MG/ML IJ SOLN: 6.2500 mg | INTRAMUSCULAR | Status: DC | PRN

## 2017-04-23 MED ORDER — FENTANYL CITRATE (PF) 100 MCG/2ML IJ SOLN
INTRAMUSCULAR | Status: AC
  Administered 2017-04-23: 50 ug via INTRAVENOUS
  Filled 2017-04-23: qty 2

## 2017-04-23 MED ORDER — PROPOFOL 10 MG/ML IV BOLUS
INTRAVENOUS | Status: DC | PRN
  Administered 2017-04-23: 200 mg via INTRAVENOUS
  Administered 2017-04-23: 50 mg via INTRAVENOUS

## 2017-04-23 MED ORDER — LIDOCAINE HCL (CARDIAC) 20 MG/ML IV SOLN
INTRAVENOUS | Status: AC
  Filled 2017-04-23: qty 5

## 2017-04-23 MED ORDER — FENTANYL CITRATE (PF) 250 MCG/5ML IJ SOLN
INTRAMUSCULAR | Status: AC
  Filled 2017-04-23: qty 5

## 2017-04-23 MED ORDER — DEXAMETHASONE SODIUM PHOSPHATE 10 MG/ML IJ SOLN
INTRAMUSCULAR | Status: DC | PRN
  Administered 2017-04-23: 8 mg via INTRAVENOUS

## 2017-04-23 MED ORDER — KETOROLAC TROMETHAMINE 30 MG/ML IJ SOLN
INTRAMUSCULAR | Status: AC
Start: 2017-04-23 — End: ?
  Filled 2017-04-23: qty 1

## 2017-04-23 MED ORDER — BUPIVACAINE HCL (PF) 0.5 % IJ SOLN
INTRAMUSCULAR | Status: DC | PRN
  Administered 2017-04-23: 5 mL

## 2017-04-23 MED ORDER — LIDOCAINE HCL (CARDIAC) 20 MG/ML IV SOLN
INTRAVENOUS | Status: DC | PRN
  Administered 2017-04-23: 100 mg via INTRAVENOUS

## 2017-04-23 MED ORDER — ONDANSETRON HCL 4 MG/2ML IJ SOLN
4.0000 mg | Freq: Once | INTRAMUSCULAR | Status: AC | PRN
  Administered 2017-04-23: 4 mg via INTRAVENOUS

## 2017-04-23 SURGICAL SUPPLY — 26 items
CATH ROBINSON RED A/P 16FR (CATHETERS) ×3 IMPLANT
CLIP FILSHIE TUBAL LIGA STRL (Clip) ×6 IMPLANT
CLOTH BEACON ORANGE TIMEOUT ST (SAFETY) ×3 IMPLANT
DRSG OPSITE POSTOP 3X4 (GAUZE/BANDAGES/DRESSINGS) ×3 IMPLANT
DURAPREP 26ML APPLICATOR (WOUND CARE) ×3 IMPLANT
GLOVE BIO SURGEON STRL SZ 6.5 (GLOVE) ×4 IMPLANT
GLOVE BIO SURGEONS STRL SZ 6.5 (GLOVE) ×2
GLOVE BIOGEL PI IND STRL 7.0 (GLOVE) ×1 IMPLANT
GLOVE BIOGEL PI INDICATOR 7.0 (GLOVE) ×2
GOWN STRL REUS W/TWL LRG LVL3 (GOWN DISPOSABLE) ×6 IMPLANT
NDL SAFETY ECLIPSE 18X1.5 (NEEDLE) ×1 IMPLANT
NEEDLE HYPO 18GX1.5 SHARP (NEEDLE) ×2
NEEDLE INSUFFLATION 120MM (ENDOMECHANICALS) ×3 IMPLANT
NS IRRIG 1000ML POUR BTL (IV SOLUTION) ×3 IMPLANT
PACK LAPAROSCOPY BASIN (CUSTOM PROCEDURE TRAY) ×3 IMPLANT
PACK TRENDGUARD 450 HYBRID PRO (MISCELLANEOUS) ×1 IMPLANT
PACK TRENDGUARD 600 HYBRD PROC (MISCELLANEOUS) IMPLANT
PROTECTOR NERVE ULNAR (MISCELLANEOUS) ×3 IMPLANT
SLEEVE XCEL OPT CAN 5 100 (ENDOMECHANICALS) IMPLANT
SUT VICRYL 0 UR6 27IN ABS (SUTURE) ×3 IMPLANT
SUT VICRYL 4-0 PS2 18IN ABS (SUTURE) ×3 IMPLANT
TOWEL OR 17X24 6PK STRL BLUE (TOWEL DISPOSABLE) ×6 IMPLANT
TRENDGUARD 450 HYBRID PRO PACK (MISCELLANEOUS) ×3
TRENDGUARD 600 HYBRID PROC PK (MISCELLANEOUS)
TROCAR OPTI TIP 5M 100M (ENDOMECHANICALS) IMPLANT
TROCAR XCEL DIL TIP R 11M (ENDOMECHANICALS) ×3 IMPLANT

## 2017-04-23 NOTE — Discharge Instructions (Signed)
Laparoscopic Tubal Ligation, Care After °Refer to this sheet in the next few weeks. These instructions provide you with information about caring for yourself after your procedure. Your health care provider may also give you more specific instructions. Your treatment has been planned according to current medical practices, but problems sometimes occur. Call your health care provider if you have any problems or questions after your procedure. °What can I expect after the procedure? °After the procedure, it is common to have: °· A sore throat. °· Discomfort in your shoulder. °· Mild discomfort or cramping in your abdomen. °· Gas pains. °· Pain or soreness in the area where the surgical cut (incision) was made. °· A bloated feeling. °· Tiredness. °· Nausea. °· Vomiting. ° °Follow these instructions at home: °Medicines °· Take over-the-counter and prescription medicines only as told by your health care provider. °· Do not take aspirin because it can cause bleeding. °· Do not drive or operate heavy machinery while taking prescription pain medicine. °Activity °· Rest for the rest of the day. °· Return to your normal activities as told by your health care provider. Ask your health care provider what activities are safe for you. °Incision care ° °· Follow instructions from your health care provider about how to take care of your incision. Make sure you: °? Wash your hands with soap and water before you change your bandage (dressing). If soap and water are not available, use hand sanitizer. °? Change your dressing as told by your health care provider. °? Leave stitches (sutures) in place. They may need to stay in place for 2 weeks or longer. °· Check your incision area every day for signs of infection. Check for: °? More redness, swelling, or pain. °? More fluid or blood. °? Warmth. °? Pus or a bad smell. °Other Instructions °· Do not take baths, swim, or use a hot tub until your health care provider approves. You may take  showers. °· Keep all follow-up visits as told by your health care provider. This is important. °· Have someone help you with your daily household tasks for the first few days. °Contact a health care provider if: °· You have more redness, swelling, or pain around your incision. °· Your incision feels warm to the touch. °· You have pus or a bad smell coming from your incision. °· The edges of your incision break open after the sutures have been removed. °· Your pain does not improve after 2-3 days. °· You have a rash. °· You repeatedly become dizzy or light-headed. °· Your pain medicine is not helping. °· You are constipated. °Get help right away if: °· You have a fever. °· You faint. °· You have increasing pain in your abdomen. °· You have severe pain in one or both of your shoulders. °· You have fluid or blood coming from your sutures or from your vagina. °· You have shortness of breath or difficulty breathing. °· You have chest pain or leg pain. °· You have ongoing nausea, vomiting, or diarrhea. °This information is not intended to replace advice given to you by your health care provider. Make sure you discuss any questions you have with your health care provider. °Document Released: 04/11/2005 Document Revised: 02/25/2016 Document Reviewed: 09/02/2015 °Elsevier Interactive Patient Education © 2018 [employer] Inc. ° °

## 2017-04-23 NOTE — Anesthesia Postprocedure Evaluation (Signed)
Anesthesia Post Note  Patient: Cephus SlaterJessica Glad  Procedure(s) Performed: Procedure(s) (LRB): LAPAROSCOPIC TUBAL LIGATION - Filshie Clips (Left)     Patient location during evaluation: PACU Anesthesia Type: General Level of consciousness: awake and alert Pain management: pain level controlled Vital Signs Assessment: post-procedure vital signs reviewed and stable Respiratory status: spontaneous breathing, nonlabored ventilation, respiratory function stable and patient connected to nasal cannula oxygen Cardiovascular status: blood pressure returned to baseline and stable Postop Assessment: no signs of nausea or vomiting Anesthetic complications: no    Last Vitals:  Vitals:   04/23/17 1130 04/23/17 1145  BP: 130/80 124/75  Pulse: 73 72  Resp: 20 18  Temp:      Last Pain:  Vitals:   04/23/17 1145  TempSrc:   PainSc: Asleep   Pain Goal: Patients Stated Pain Goal: 3 (04/23/17 0731)               Khara Renaud

## 2017-04-23 NOTE — H&P (Signed)
Kristine SlaterJessica Harshberger is an 26 y.o. HP2 here for a laparoscopic application of Filsche clips for undesired fertility. She has been abstinent for the last year. She is certain that she does not want more kids. She declines alternative forms of contraception.   Patient's last menstrual period was 04/08/2017 (exact date).    Past Medical History:  Diagnosis Date  . Diverticulitis   . GERD (gastroesophageal reflux disease)   . Hypertension    resolved    Past Surgical History:  Procedure Laterality Date  . CHOLECYSTECTOMY    . OOPHORECTOMY      Family History  Problem Relation Age of Onset  . Cancer Maternal Grandfather   . Diabetes Paternal Grandmother     Social History:  reports that she has quit smoking. Her smoking use included Cigarettes. She has a 0.50 pack-year smoking history. She has quit using smokeless tobacco. She reports that she does not drink alcohol or use drugs.  Allergies: No Known Allergies  Prescriptions Prior to Admission  Medication Sig Dispense Refill Last Dose  . famotidine (PEPCID) 20 MG tablet Take 1 tablet (20 mg total) by mouth 2 (two) times daily. (Patient not taking: Reported on 03/16/2017) 60 tablet 3 Not Taking    ROS She had an ovary removed via a laparotomy as a 89 yo child. She has also had her gallbladder removed.   Blood pressure 121/75, pulse 66, temperature 98 F (36.7 C), temperature source Oral, resp. rate 18, last menstrual period 04/08/2017, SpO2 100 %, currently breastfeeding. Physical Exam  Heart- rrr Lungs- CTAB Abd- obese, benign, large indented vertical scar from umbilicus to symphysis pubis  Results for orders placed or performed during the hospital encounter of 04/23/17 (from the past 24 hour(s))  Pregnancy, urine     Status: None   Collection Time: 04/23/17  7:30 AM  Result Value Ref Range   Preg Test, Ur NEGATIVE NEGATIVE    No results found.  Assessment/Plan: Undesired fertility- plan for laparoscopic application of  Filsche clips.  She understands the risks of surgery, including, but not to infection, bleeding, DVTs, damage to bowel, bladder, ureters. She wishes to proceed. She understands that there is a 1/400 failure rate of this procedure.      Juliette Standre C Selenia Mihok 04/23/2017, 9:30 AM

## 2017-04-23 NOTE — Transfer of Care (Signed)
Immediate Anesthesia Transfer of Care Note  Patient: Kristine Cordova  Procedure(s) Performed: Procedure(s): LAPAROSCOPIC TUBAL LIGATION - Filshie Clips (Left)  Patient Location: PACU  Anesthesia Type:General  Level of Consciousness: awake, alert , oriented and patient cooperative  Airway & Oxygen Therapy: Patient Spontanous Breathing and Patient connected to nasal cannula oxygen  Post-op Assessment: Report given to RN and Post -op Vital signs reviewed and stable  Post vital signs: Reviewed and stable  Last Vitals:  Vitals:   04/23/17 0731  BP: 121/75  Pulse: 66  Resp: 18  Temp: 36.7 C    Last Pain:  Vitals:   04/23/17 0731  TempSrc: Oral      Patients Stated Pain Goal: 3 (04/23/17 0731)  Complications: No apparent anesthesia complications

## 2017-04-23 NOTE — Op Note (Signed)
04/23/2017  11:36 AM  PATIENT:  Kristine Cordova  26 y.o. female  PRE-OPERATIVE DIAGNOSIS:  Undesired Fertiltiy, morbid obesity  POST-OPERATIVE DIAGNOSIS:  Undesired Fertiltiy, morbid obesity  PROCEDURE:  Procedure(s): LAPAROSCOPIC TUBAL LIGATION - Filshie Clips (Left)  SURGEON:  Surgeon(s) and Role:    * Jennesis Ramaswamy C, MD - Primary   ANESTHESIA:   local and general  EBL:  Total I/O In: 1600 [I.V.:1600] Out: 125 [Urine:25; Blood:100]  BLOOD ADMINISTERED:none  DRAINS: none   LOCAL MEDICATIONS USED:  MARCAINE     SPECIMEN:  No Specimen  DISPOSITION OF SPECIMEN:  N/A  COUNTS:  YES  TOURNIQUET:  * No tourniquets in log *  DICTATION: .Dragon Dictation  PLAN OF CARE: Discharge to home after PACU  PATIENT DISPOSITION:  PACU - hemodynamically stable.   Delay start of Pharmacological VTE agent (>24hrs) due to surgical blood loss or risk of bleeding: not applicable   The risks, benefits, alternatives of surgery were explained understood, accepted. All questions were answered. She understands the failure rate of 1/400. She declines alternative forms of birth control. Her urine pregnancy test was negative. She was taken to the operating room and general anesthesia was applied without complication. She was placed in dorsal lithotomy position. Her abdomen and vagina were prepped and draped in the usual sterile fashion. A time out procedure was done. A bimanual exam revealed a normal, size and shape mobile uterus with nonenlarged adnexa. A Hulka manipulator was placed on the cervix. Gloves were changed and attention was turned to the abdomen. Because of the long dented in vertical scar from her umbilicus to her symphysis pubis, I first made a 5 mm incision at the LUQ below the ribcage. I first injected about 5 cc of 0.5% marcaine. I placed a Veress Needle but the patient abdominal pressure was 8, more than I am comfortable with. I therefore made an incision at the top of the umbilicus and  placed the Veress needle there. Patient pressure was 3, so I then used  low-flow CO2 was used to insufflate the abdomen to approximately 3 L. After good pneumoperitoneum was established, a 11 mm trocar was placed. Laparoscopy confirmed correct placement. She was placed in the Trendelenburg position. Patient abdominal pressure was always less than 15. Her uterus ovaries and tubes appeared normal. There was no evidence of endometriosis. There was no bleeding noted of the omentum below the LUQ.  The oviducts were visually traced to their fimbriated end on each side. In the isthmic region of each oviduct, a Filshie clip was placed across the entire oviduct. There was no bleeding. The CO2 was allowed to escape from her abdomen. The umbilical fascia was closed with a figure of 0 Vicryl suture. No defects were palpable. The subcuticular closure was done with 4-0 Vicryl suture. The Hulka manipulator was removed. There was some bleeding noted from the Hulka site, so I placed a figure of 8 2-0 vicryl suture, yielding hemostasis. She was extubated and taken to recovery in stable condition. She tolerated the procedure well. The instrument, sponge, and needle counts were correct.

## 2017-04-23 NOTE — Addendum Note (Signed)
Addendum  created 04/23/17 1755 by Jairo BenJackson, Luetta Piazza, MD   Sign clinical note

## 2017-04-23 NOTE — Anesthesia Procedure Notes (Signed)
Procedure Name: Intubation Date/Time: 04/23/2017 10:00 AM Performed by: Georgeanne Nim Pre-anesthesia Checklist: Patient identified, Timeout performed, Emergency Drugs available, Suction available and Patient being monitored Patient Re-evaluated:Patient Re-evaluated prior to induction Oxygen Delivery Method: Circle system utilized Preoxygenation: Pre-oxygenation with 100% oxygen Induction Type: IV induction Ventilation: Mask ventilation without difficulty Laryngoscope Size: Mac and 3 Grade View: Grade I Tube type: Oral Number of attempts: 1 Airway Equipment and Method: Stylet Placement Confirmation: ETT inserted through vocal cords under direct vision,  breath sounds checked- equal and bilateral,  positive ETCO2 and CO2 detector Secured at: 21 cm Tube secured with: Tape Dental Injury: Teeth and Oropharynx as per pre-operative assessment

## 2017-04-24 ENCOUNTER — Encounter (HOSPITAL_COMMUNITY): Payer: Self-pay | Admitting: Obstetrics & Gynecology

## 2017-04-24 NOTE — Addendum Note (Signed)
Addendum  created 04/24/17 1252 by Bexlee Bergdoll P, CRNA   Charge Capture section accepted    

## 2017-05-01 ENCOUNTER — Encounter (HOSPITAL_BASED_OUTPATIENT_CLINIC_OR_DEPARTMENT_OTHER): Payer: Self-pay | Admitting: Emergency Medicine

## 2017-05-01 ENCOUNTER — Emergency Department (HOSPITAL_BASED_OUTPATIENT_CLINIC_OR_DEPARTMENT_OTHER)
Admission: EM | Admit: 2017-05-01 | Discharge: 2017-05-01 | Disposition: A | Discharge status: Home / Self Care | Payer: Medicaid Other | Attending: Emergency Medicine | Admitting: Emergency Medicine

## 2017-05-01 DIAGNOSIS — I10 Essential (primary) hypertension: Secondary | ICD-10-CM | POA: Diagnosis not present

## 2017-05-01 DIAGNOSIS — Z87891 Personal history of nicotine dependence: Secondary | ICD-10-CM | POA: Insufficient documentation

## 2017-05-01 DIAGNOSIS — Z79899 Other long term (current) drug therapy: Secondary | ICD-10-CM | POA: Insufficient documentation

## 2017-05-01 DIAGNOSIS — N3000 Acute cystitis without hematuria: Secondary | ICD-10-CM | POA: Diagnosis not present

## 2017-05-01 DIAGNOSIS — R509 Fever, unspecified: Secondary | ICD-10-CM | POA: Diagnosis present

## 2017-05-01 LAB — BASIC METABOLIC PANEL
ANION GAP: 9 (ref 5–15)
BUN: 9 mg/dL (ref 6–20)
CHLORIDE: 103 mmol/L (ref 101–111)
CO2: 27 mmol/L (ref 22–32)
Calcium: 9 mg/dL (ref 8.9–10.3)
Creatinine, Ser: 0.74 mg/dL (ref 0.44–1.00)
GFR calc non Af Amer: >60 mL/min (ref >60)
GLUCOSE: 101 mg/dL — AB (ref 65–99)
POTASSIUM: 3.6 mmol/L (ref 3.5–5.1)
Sodium: 139 mmol/L (ref 135–145)

## 2017-05-01 LAB — CBC WITH DIFFERENTIAL/PLATELET
BASOS ABS: 0 10*3/uL (ref 0.0–0.1)
BASOS PCT: 0 %
Eosinophils Absolute: 0.2 10*3/uL (ref 0.0–0.7)
Eosinophils Relative: 2 %
HEMATOCRIT: 30.7 % — AB (ref 36.0–46.0)
HEMOGLOBIN: 10.3 g/dL — AB (ref 12.0–15.0)
LYMPHS PCT: 16 %
Lymphs Abs: 1.6 10*3/uL (ref 0.7–4.0)
MCH: 28.1 pg (ref 26.0–34.0)
MCHC: 33.6 g/dL (ref 30.0–36.0)
MCV: 83.9 fL (ref 78.0–100.0)
Monocytes Absolute: 0.9 10*3/uL (ref 0.1–1.0)
Monocytes Relative: 9 %
NEUTROS ABS: 7.4 10*3/uL (ref 1.7–7.7)
NEUTROS PCT: 73 %
Platelets: 277 10*3/uL (ref 150–400)
RBC: 3.66 MIL/uL — ABNORMAL LOW (ref 3.87–5.11)
RDW: 15 % (ref 11.5–15.5)
WBC: 10.1 10*3/uL (ref 4.0–10.5)

## 2017-05-01 LAB — URINALYSIS, ROUTINE W REFLEX MICROSCOPIC
Bilirubin Urine: NEGATIVE
Glucose, UA: NEGATIVE mg/dL
Ketones, ur: NEGATIVE mg/dL
NITRITE: NEGATIVE
PROTEIN: NEGATIVE mg/dL
SPECIFIC GRAVITY, URINE: 1.017 (ref 1.005–1.030)
pH: 6 (ref 5.0–8.0)

## 2017-05-01 LAB — URINALYSIS, MICROSCOPIC (REFLEX)

## 2017-05-01 LAB — PREGNANCY, URINE: PREG TEST UR: NEGATIVE

## 2017-05-01 MED ORDER — CEPHALEXIN 250 MG PO CAPS
500.0000 mg | ORAL_CAPSULE | Freq: Once | ORAL | Status: AC
  Administered 2017-05-01: 500 mg via ORAL
  Filled 2017-05-01: qty 2

## 2017-05-01 MED ORDER — CEPHALEXIN 500 MG PO CAPS: 500.0000 mg | ORAL_CAPSULE | Freq: Four times a day (QID) | ORAL | 0 refills | Status: DC

## 2017-05-01 NOTE — ED Notes (Signed)
ED Provider at bedside. 

## 2017-05-01 NOTE — ED Provider Notes (Signed)
[facility] Provider Note   CSN: [id number] Arrival date & time: 05/01/17  1718     History   Chief Complaint Chief Complaint  Patient presents with  . Fever    HPI Kristine Cordova is a 26 y.o. female.  HPI Patient presents to the emergency room for evaluation of fever that started a couple days ago. Patient states she's had temperatures up to about 100.4 and 100.7. Associated with the symptoms, she's had some intermittent discomfort in the left flank region.  She also has been having urinary burning and discomfort. She denies any trouble with cough. No rashes. No vomiting or diarrhea. Patient's recently was in the hospital on July 9 15 and had a tubal ligation. She has been doing fine since that surgery. Past Medical History:  Diagnosis Date  . Diverticulitis   . GERD (gastroesophageal reflux disease)   . Hypertension    resolved    Patient Active Problem List   Diagnosis Date Noted  . Normal labor 02/06/2017  . 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 Maternal morbid obesity, antepartum (HCC) 10/01/2016  . Rh negative state in antepartum period 08/08/2016  . Supervision of other normal pregnancy, antepartum 08/06/2016  . Group B streptococcus urinary tract infection affecting pregnancy 07/28/2016  . Prehypertension 07/04/2016  . Chronic GERD 03/26/2016    Past Surgical History:  Procedure Laterality Date  . CHOLECYSTECTOMY    . LAPAROSCOPIC TUBAL LIGATION Left 04/23/2017   Procedure: LAPAROSCOPIC TUBAL LIGATION - Filshie Clips;  Surgeon: Allie Bossierove, Myra C, MD;  Location: [facility];  Service: Gynecology;  Laterality: Left;  . OOPHORECTOMY      OB History    Gravida Para Term Preterm AB Living   2 2 2     2    SAB TAB Ectopic Multiple Live Births         0 1       Home Medications    Prior to Admission medications   Medication Sig Start Date End Date Taking? Authorizing Provider  cephALEXin (KEFLEX)  500 MG capsule Take 1 capsule (500 mg total) by mouth 4 (four) times daily. 05/01/17   Linwood DibblesKnapp, Suhas Estis, MD  famotidine (PEPCID) 20 MG tablet Take 1 tablet (20 mg total) by mouth 2 (two) times daily. Patient not taking: Reported on 03/16/2017 12/01/16   Levie HeritageStinson, Jacob J, DO  ibuprofen (ADVIL,MOTRIN) 600 MG tablet Take 1 tablet (600 mg total) by mouth every 6 (six) hours as needed. 04/23/17   Allie Bossierove, Myra C, MD  oxyCODONE-acetaminophen (PERCOCET/ROXICET) 5-325 MG tablet Take 1-2 tablets by mouth every 6 (six) hours as needed. 04/23/17   Allie Bossierove, Myra C, MD    Family History Family History  Problem Relation Age of Onset  . Cancer Maternal Grandfather   . Diabetes Paternal Grandmother     Social History Social History  Substance Use Topics  . Smoking status: Former Smoker    Packs/day: 0.25    Years: 2.00    Types: Cigarettes  . Smokeless tobacco: Former NeurosurgeonUser  . Alcohol use No     Allergies   Patient has no known allergies.   Review of Systems Review of Systems   Physical Exam Updated Vital Signs BP 123/76 (BP Location: Right Arm)   Pulse 92   Temp 99.3 F (37.4 C) (Oral)   Resp 20   LMP 04/08/2017 (Exact Date)   SpO2 100%   Physical Exam  Constitutional: She appears well-developed and well-nourished. No distress.  HENT:  Head: Normocephalic and atraumatic.  Right Ear: External ear normal.  Left Ear: External ear normal.  Eyes: Conjunctivae are normal. Right eye exhibits no discharge. Left eye exhibits no discharge. No scleral icterus.  Neck: Neck supple. No tracheal deviation present.  Cardiovascular: Normal rate, regular rhythm and intact distal pulses.   Pulmonary/Chest: Effort normal and breath sounds normal. No stridor. No respiratory distress. She has no wheezes. She has no rales.  Abdominal: Soft. Bowel sounds are normal. She exhibits no distension. There is no tenderness. There is no rebound and no guarding.  Surgical scars without any erythema or drainage    Musculoskeletal: She exhibits no edema or tenderness.  Neurological: She is alert. She has normal strength. No cranial nerve deficit (no facial droop, extraocular movements intact, no slurred speech) or sensory deficit. She exhibits normal muscle tone. She displays no seizure activity. Coordination normal.  Skin: Skin is warm and dry. No rash noted.  Psychiatric: She has a normal mood and affect.  Nursing note and vitals reviewed.    ED Treatments / Results  Labs (all labs ordered are listed, but only abnormal results are displayed) Labs Reviewed  CBC WITH DIFFERENTIAL/PLATELET - Abnormal; Notable for the following:       Result Value   RBC 3.66 (*)    Hemoglobin 10.3 (*)    HCT 30.7 (*)    All other components within normal limits  BASIC METABOLIC PANEL - Abnormal; Notable for the following:    Glucose, Bld 101 (*)    All other components within normal limits  URINALYSIS, ROUTINE W REFLEX MICROSCOPIC - Abnormal; Notable for the following:    APPearance CLOUDY (*)    Hgb urine dipstick MODERATE (*)    Leukocytes, UA MODERATE (*)    All other components within normal limits  URINALYSIS, MICROSCOPIC (REFLEX) - Abnormal; Notable for the following:    Bacteria, UA MANY (*)    Squamous Epithelial / LPF TOO NUMEROUS TO COUNT (*)    All other components within normal limits  URINE CULTURE  PREGNANCY, URINE     Procedures Procedures (including critical care time)  Medications Ordered in ED Medications  cephALEXin (KEFLEX) capsule 500 mg (not administered)     Initial Impression / Assessment and Plan / ED Course  I have reviewed the triage vital signs and the nursing notes.  Pertinent labs & imaging results that were available during my care of the patient were reviewed by me and considered in my medical decision making (see chart for details).   patient presented to the emergency room with complaints of urinary symptoms and low-grade temperatures at home. Her laboratory  tests are reassuring with the exception of her urinalysis suggesting a urinary tract infection. There is squamous cell contamination however the patient does have many bacteria and symptoms suggestive of urinary tract infection.  We discussed outpatient treatment with Keflex. Because of her low-grade temperatures I will have her take Keflex for a week, 4 times daily. Patient understands to return for fever or worsening symptoms. Otherwise, I recommend she follow up with her doctor to make sure the infection resolves.  Final Clinical Impressions(s) / ED Diagnoses   Final diagnoses:  Acute cystitis without hematuria    New Prescriptions New Prescriptions   CEPHALEXIN (KEFLEX) 500 MG CAPSULE    Take 1 capsule (500 mg total) by mouth 4 (four) times daily.     Linwood DibblesKnapp, Kynnedy Carreno, MD 05/01/17 [phone]

## 2017-05-01 NOTE — ED Notes (Signed)
MD at bedside. 

## 2017-05-01 NOTE — Discharge Instructions (Signed)
Take the antibiotics until they are gone.  Follow up with your primary doctor or OB GYN doctor to make sure the infection has resolved.  Return for worsening symptoms.  You can continue to safely breast feed while taking the antibiotic.

## 2017-05-01 NOTE — ED Triage Notes (Signed)
Pt [facility] Reports fever x 2 days.

## 2017-05-03 LAB — URINE CULTURE

## 2017-05-04 ENCOUNTER — Telehealth: Payer: Self-pay | Admitting: Emergency Medicine

## 2017-05-04 NOTE — Telephone Encounter (Signed)
Post ED Visit - Positive Culture Follow-up: Successful Patient Follow-Up  Culture assessed and recommendations reviewed by: []  Enzo BiNathan Batchelder, Pharm.D. []  Celedonio MiyamotoJeremy Frens, Pharm.D., BCPS AQ-ID []  Garvin FilaMike Maccia, Pharm.D., BCPS []  Georgina PillionElizabeth Martin, [address]Ph[address]D., BCPS []  CrabtreeMinh Pham, [address]Pharm[address]D., BCPS, AAHIVP []  Estella HuskMichelle Turner, Pharm.D., BCPS, AAHIVP []  Lysle Pearlachel Rumbarger, PharmD, BCPS []  Casilda Carlsaylor Stone, PharmD, BCPS []  Pollyann SamplesAndy Johnston, PharmD, BCPS Sharin MonsEmily Sinclair PharmD  Positive urine culture  []  Patient discharged without antimicrobial prescription and treatment is now indicated []  Organism is resistant to prescribed ED discharge antimicrobial []  Patient with positive blood cultures  Changes discussed with ED provider: Demetrios LollKenneth Leaphart PA New antibiotic prescription, was rx cephalexin, is still symtomatic, if still symptomatic, recollect culture  Attempting to contact patient   Berle MullMiller, Dewel Lotter 05/04/2017, 10:35 AM

## 2017-06-10 ENCOUNTER — Encounter: Payer: Self-pay | Admitting: Obstetrics & Gynecology

## 2017-06-10 ENCOUNTER — Ambulatory Visit (INDEPENDENT_AMBULATORY_CARE_PROVIDER_SITE_OTHER): Payer: Self-pay | Admitting: Obstetrics & Gynecology

## 2017-06-10 VITALS — BP 128/72 | HR 89 | Ht 64.0 in | Wt 228.0 lb

## 2017-06-10 DIAGNOSIS — Z9889 Other specified postprocedural states: Secondary | ICD-10-CM

## 2017-06-10 NOTE — Progress Notes (Signed)
   Subjective:    Patient ID: Kristine Cordova, female    DOB: 21-Aug-1991, 26 y.o.   MRN: [id number]  HPI 26 yo lady here for a post op visit. I placed Filsche clips on 04-23-17. She denies any post op issues except that she has had 2 periods since then.   Review of Systems     Objective:   Physical Exam Pleasant obese Hispanic female Breathing, conversing, and ambulating normally Interpretor present for exam Abd- benign Incisions- healed well       Assessment & Plan:  2 periods this past month- offered cyclic provera, rec weight loss RTC 4 weeks for Weight check.  We discussed several strategies for weight loss.

## 2017-07-17 ENCOUNTER — Other Ambulatory Visit: Payer: Medicaid Other | Admitting: Obstetrics & Gynecology

## 2017-07-17 NOTE — Progress Notes (Signed)
   Subjective:    Patient ID: Kristine Cordova, female    DOB: 1990/11/21, 26 y.o.   MRN: [id number]  HPI 26 yo HP2 here for a weight check. We discussed healthy eating habits at her post op visit about 6 weeks ago. She reports that she has eaten better but has gained 2 pounds since that visit.   Review of Systems     Objective:   Physical Exam Obese, pleasant Hispanic female No distress Breathing, conversing, and ambulating normally Well nourished, well hydrated Hispanic female, no apparent distress     Assessment & Plan:  Morbid obesity- check TSH Refer to nutritionist I would like to refer her to Bariatrics, but Medicaid does not cover this. She will follow up with her fam med doc

## 2017-07-17 NOTE — Progress Notes (Signed)
Patient presents for weight check per Dr. Marice Potter. Patient has gained two pounds since postpartum visit. Patient states she is eating healthier than she was. Armandina Stammer RNBSN

## 2017-09-03 ENCOUNTER — Ambulatory Visit: Payer: Medicaid Other | Admitting: Family Medicine

## 2018-03-12 IMAGING — US US OB COMP LESS 14 WK
1 series · 15 of 28 positions shown · non-contrast
Comparison: CT of the abdomen and pelvis from 04/05/2016

CLINICAL DATA: Acute onset of vaginal bleeding.  Initial encounter.

EXAM:
OBSTETRIC <14 WK US AND TRANSVAGINAL OB US
TECHNIQUE: Both transabdominal and transvaginal ultrasound examinations were
performed for complete evaluation of the gestation as well as the
maternal uterus, adnexal regions, and pelvic cul-de-sac.
Transvaginal technique was performed to assess early pregnancy.

[Series 1: us ob comp less 14 wk · 54 acquisitions, 15 frames shown]
[im 1/54]
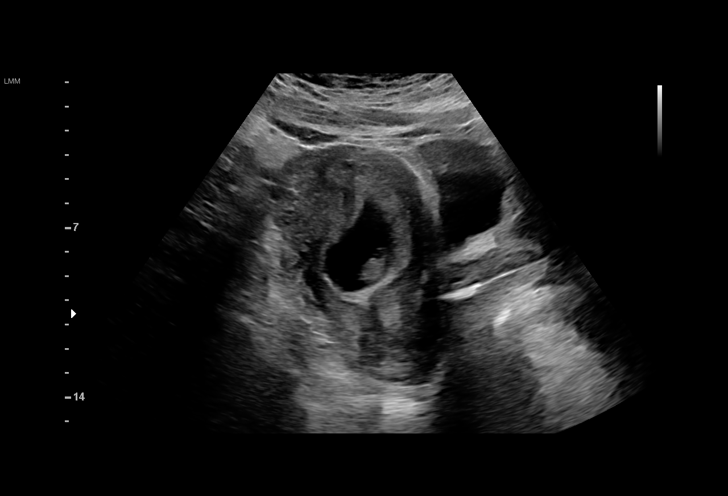
[im 4/54]
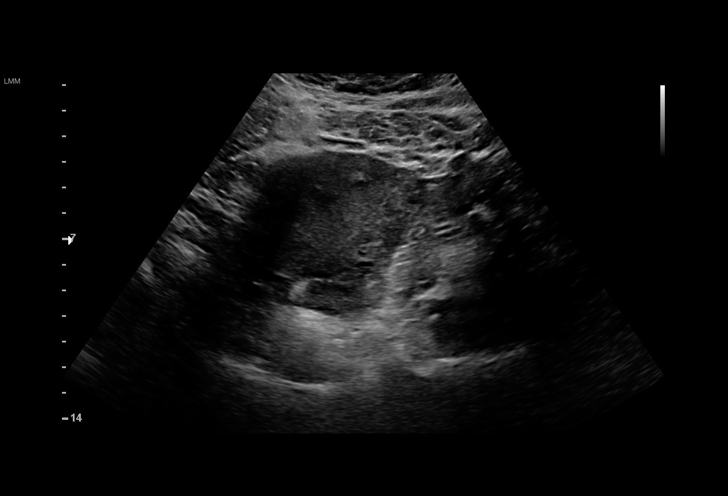
[im 8/54]
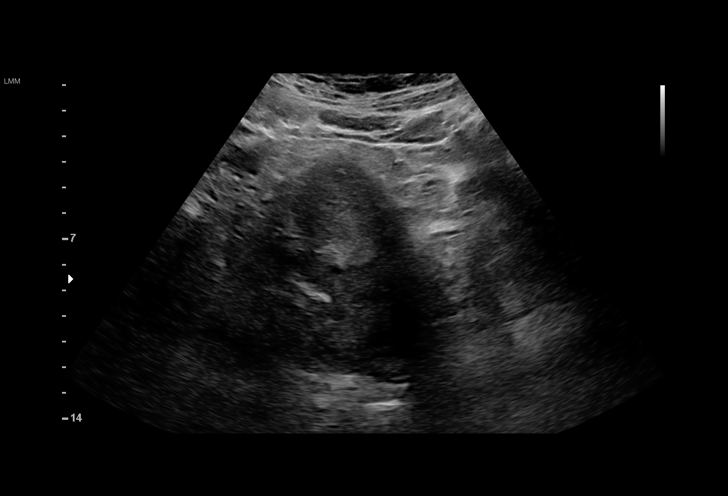
[im 12/54]
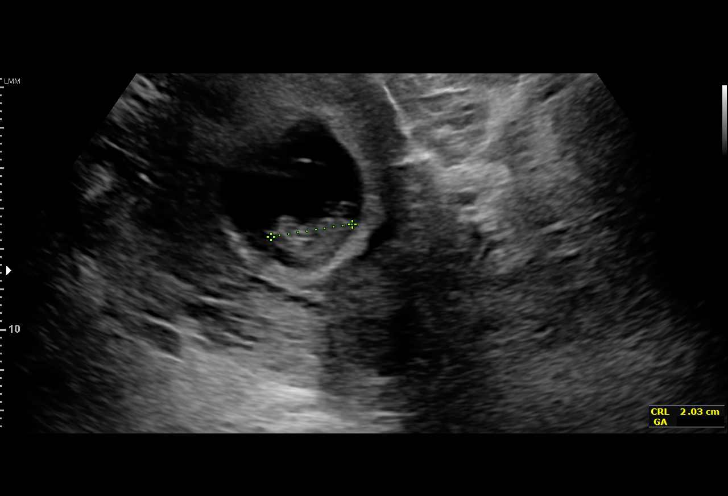
[im 16/54]
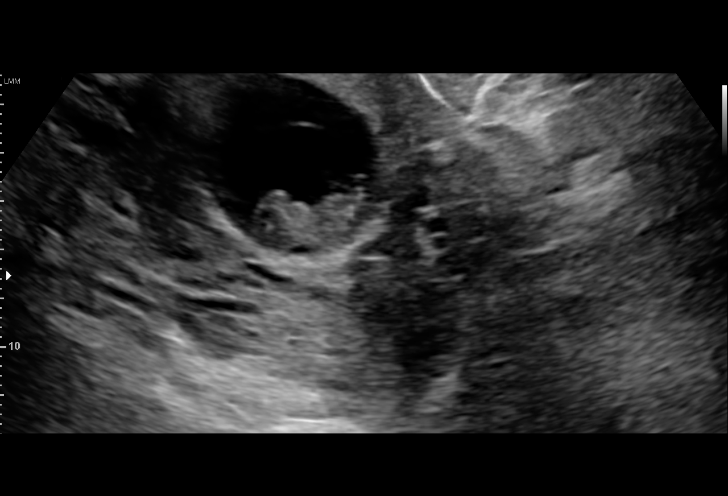
[im 20/54]
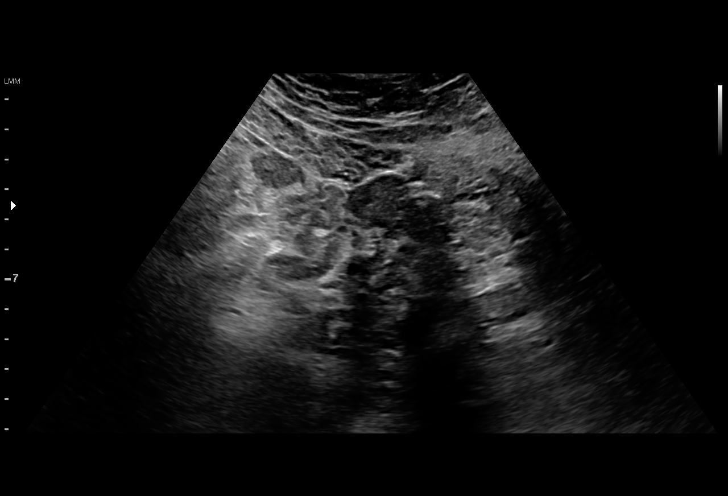
[im 24/54]
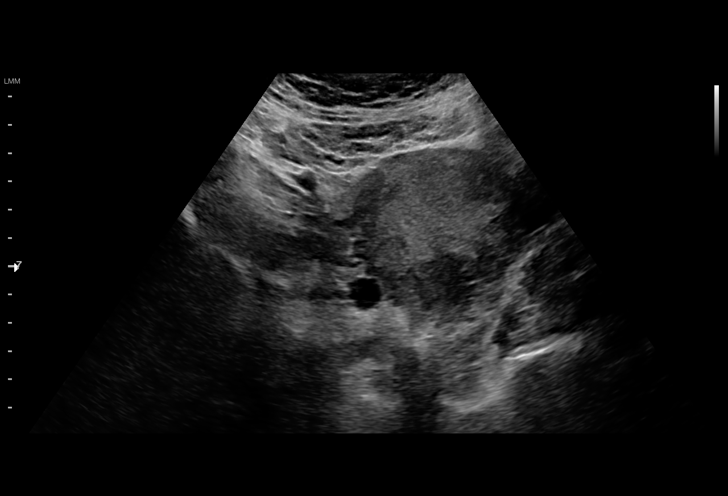
[im 28/54]
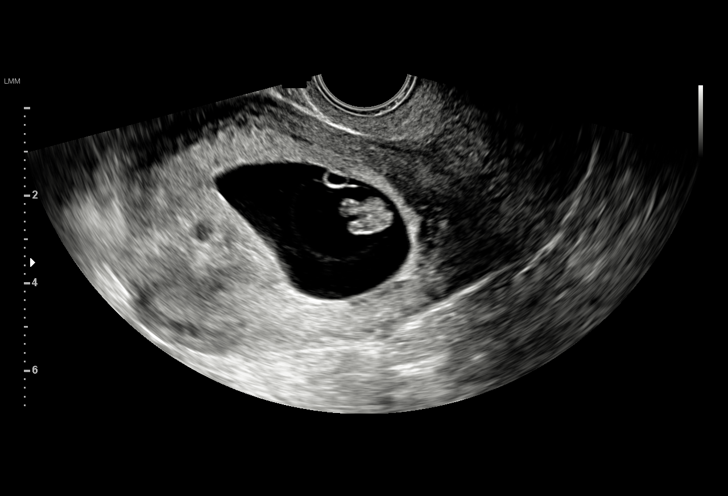
[im 30/54]
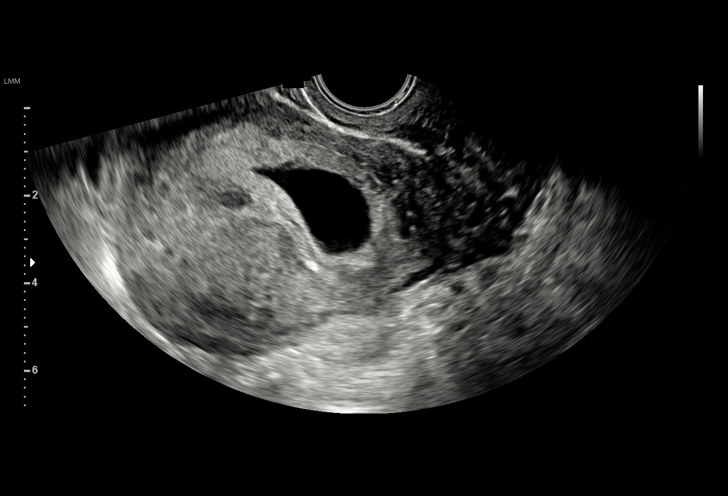
[im 34/54]
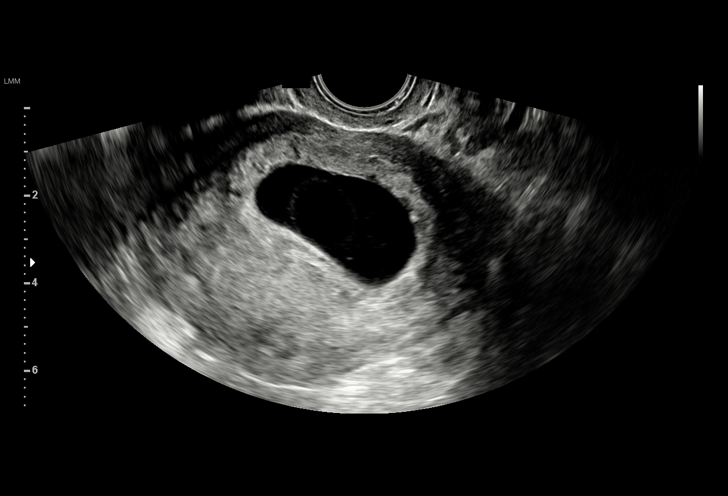
[im 38/54]
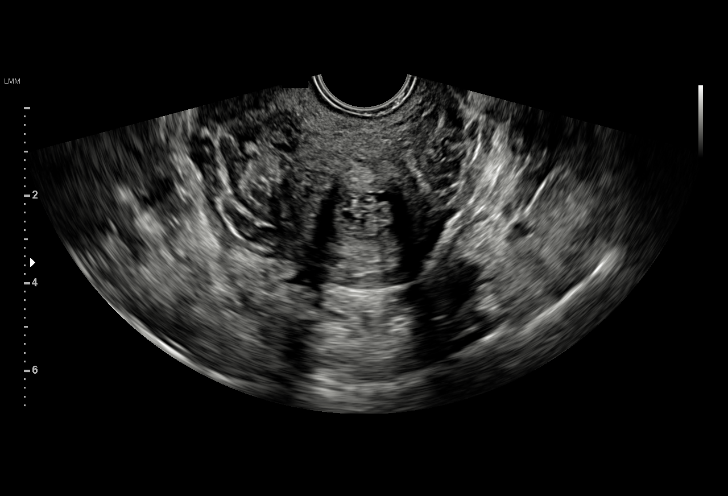
[im 42/54]
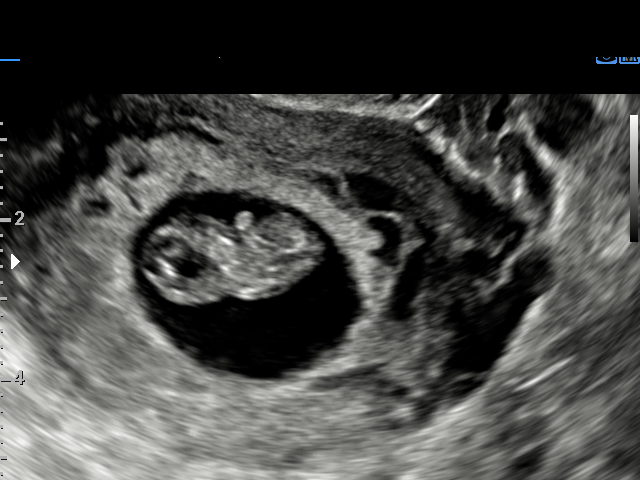
[im 46/54]
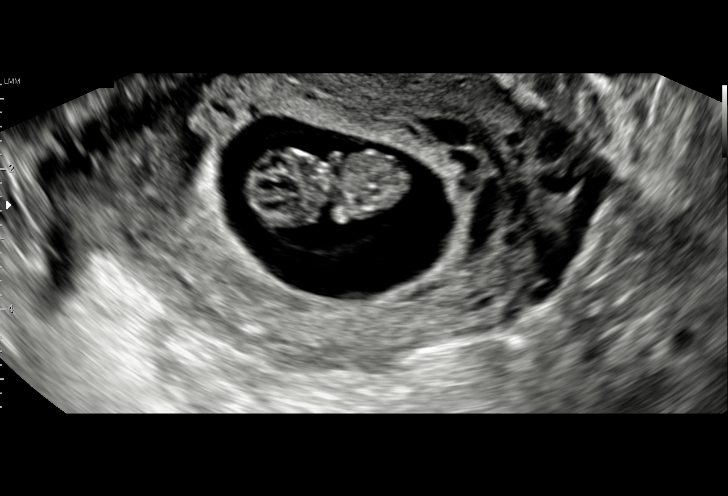
[im 50/54]
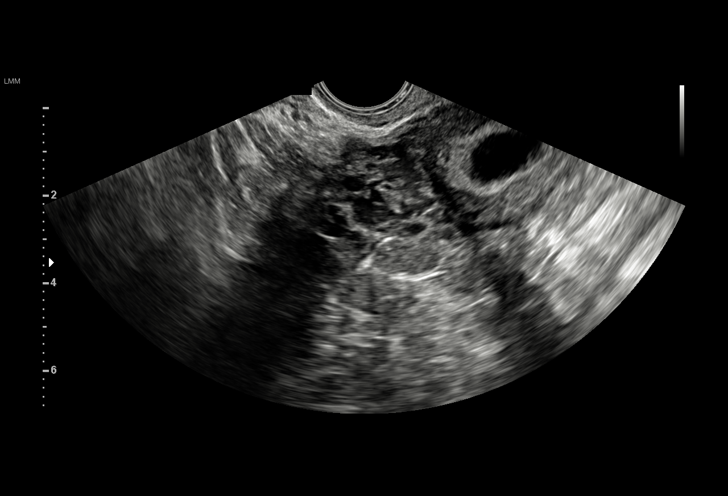
[im 54/54]
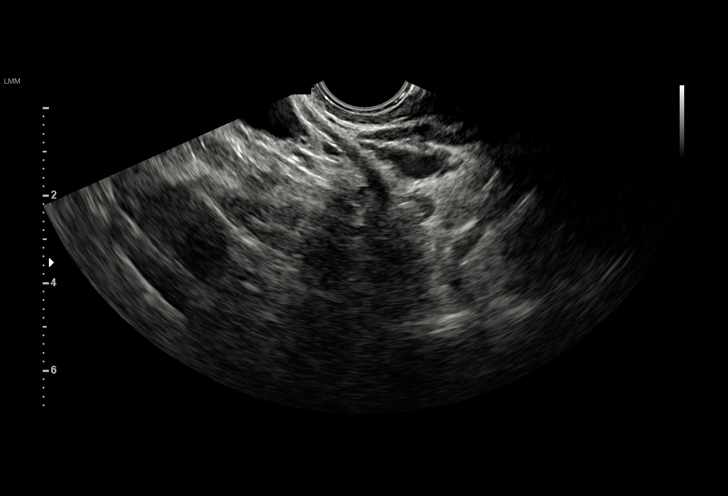

[15 of 28 positions shown; findings below may reference images not displayed]

FINDINGS: Intrauterine gestational sac: Single; visualized and normal in
shape.

Yolk sac:  Yes

Embryo:  Yes

Cardiac Activity: Yes

Heart Rate: 161  bpm

CRL:  2.5 cm   9 w   1 d                  US EDC: 02/20/2017

Subchorionic hemorrhage:  None visualized.

Maternal uterus/adnexae: The uterus is otherwise unremarkable.

The patient is status post right-sided oophorectomy. The left ovary
is unremarkable in appearance, measuring 4.4 x 3.2 x 4.0 cm. No
suspicious adnexal masses are seen; there is no evidence for ovarian
torsion.

No free fluid is seen within the pelvic cul-de-sac.
IMPRESSION: Single live intrauterine pregnancy noted, with a crown-rump length
of 2.5 cm, reflecting a gestational age of 9 weeks 1 day. This does
not match the gestational age by LMP, and reflects a new estimated
date of delivery February 20, 2017.

## 2018-09-06 IMAGING — US US MFM OB FOLLOW-UP
1 series · 14 of 28 positions shown · non-contrast
Comparison: none

[Series 1: us mfm ob follow-up · 14 of 45 slices shown]
[im 2/45]
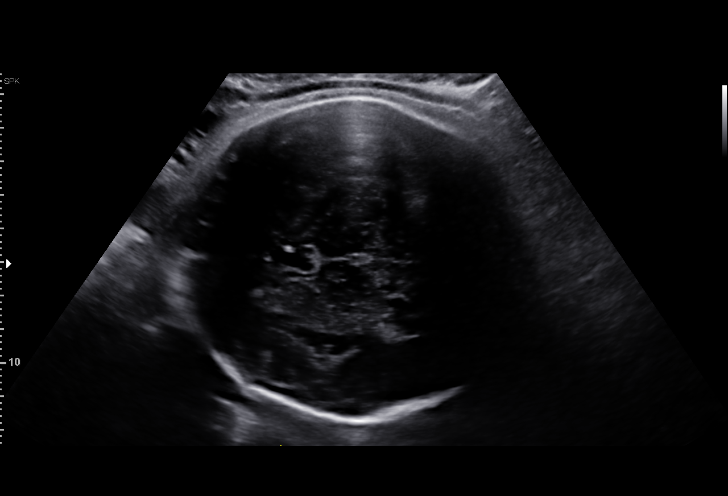
[im 5/45]
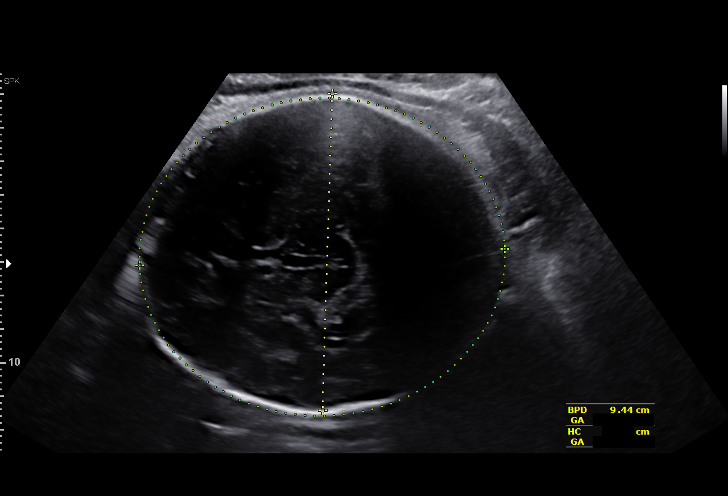
[im 9/45]
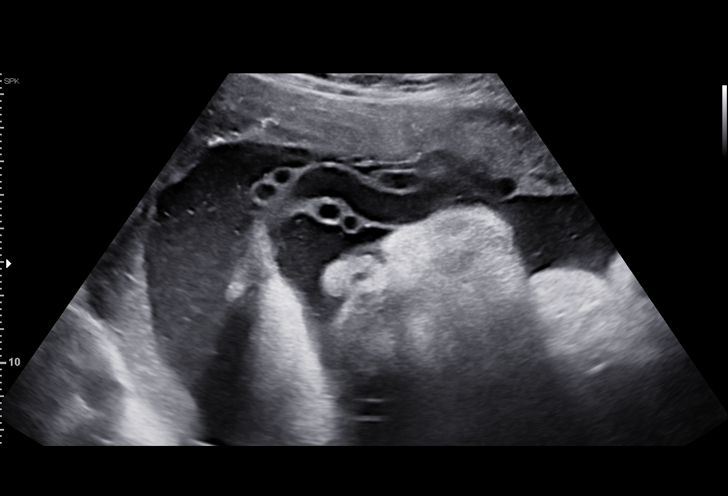
[im 12/45]
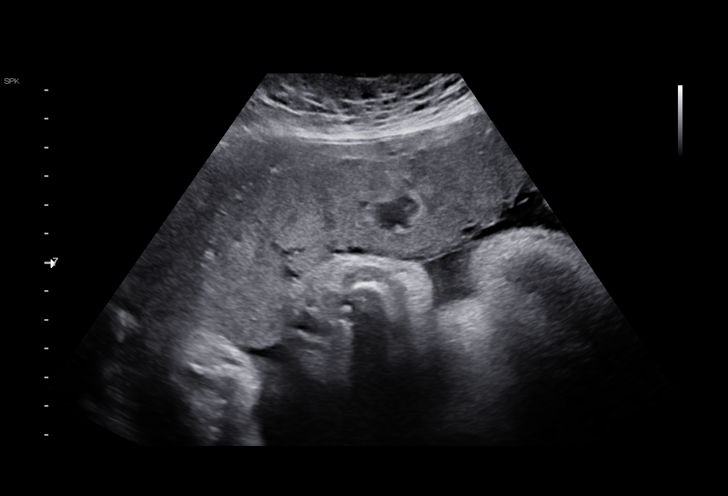
[im 15/45]
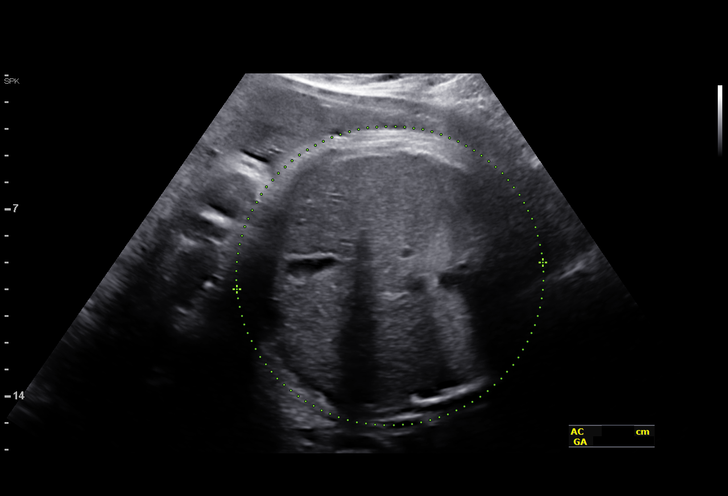
[im 18/45]
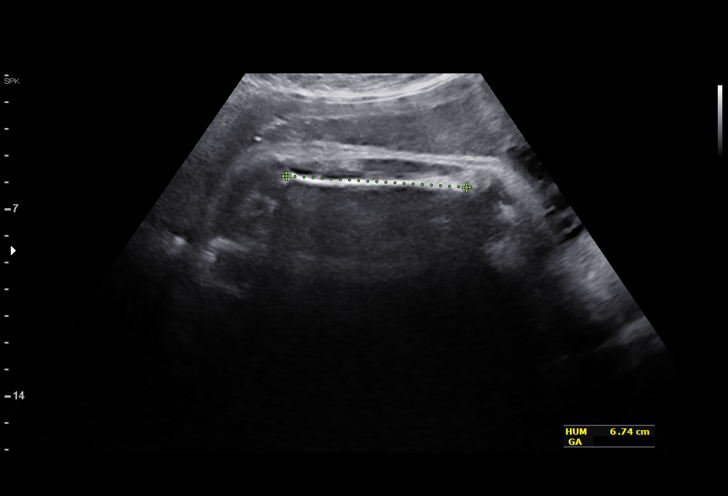
[im 22/45]
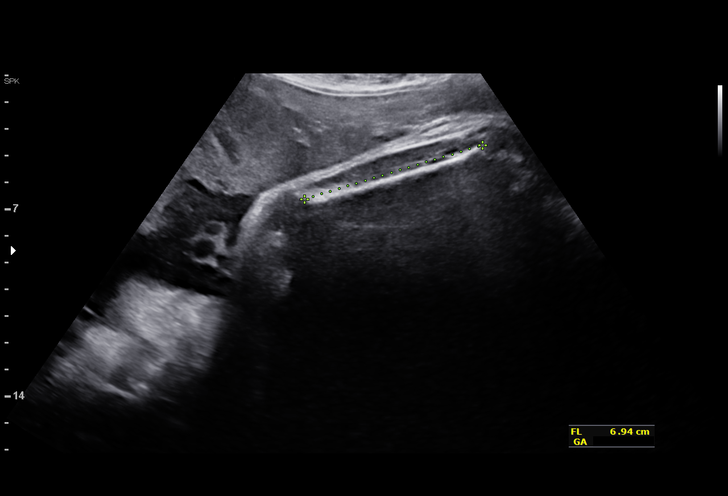
[im 25/45]
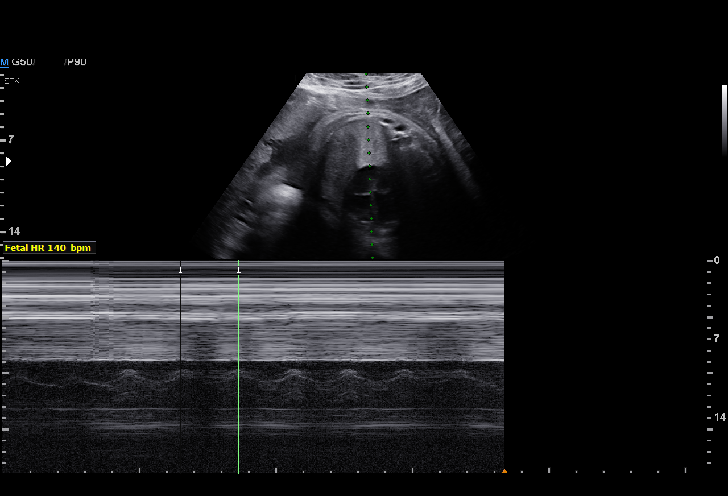
[im 28/45]
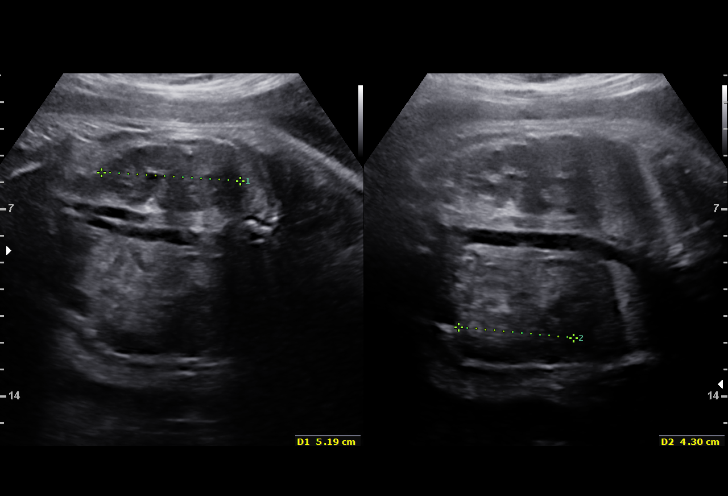
[im 31/45]
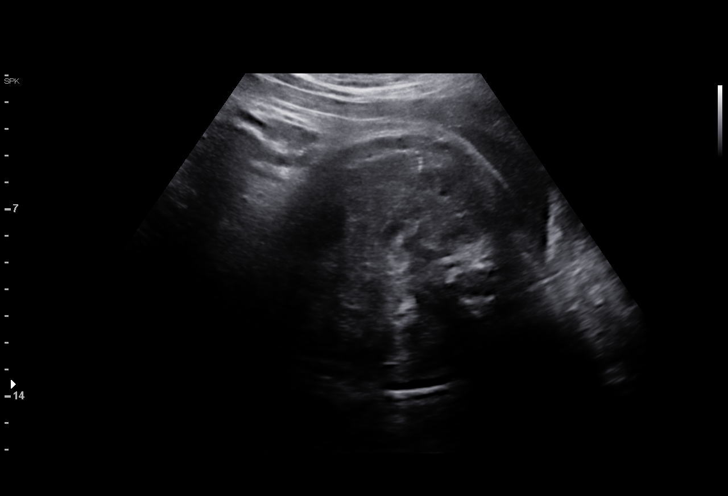
[im 35/45]
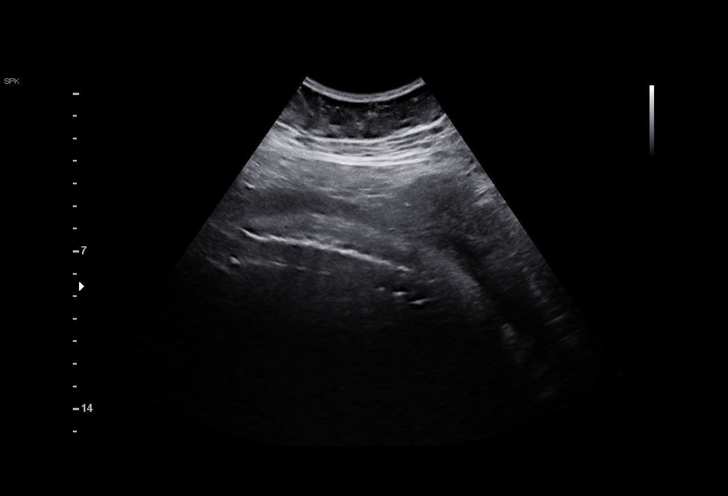
[im 38/45]
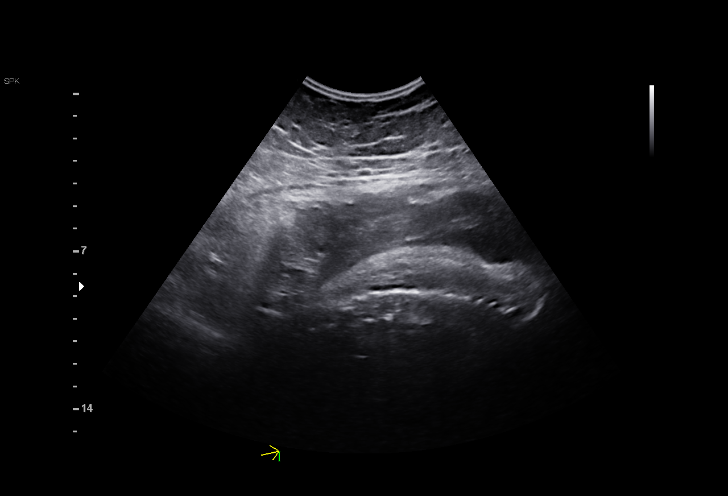
[im 41/45]
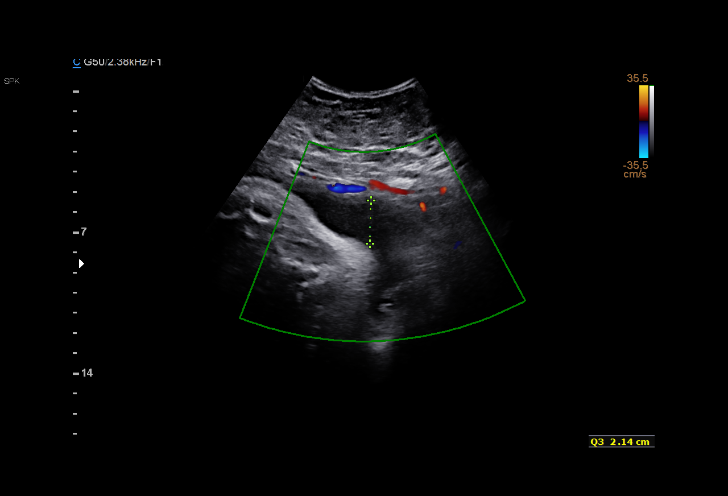
[im 45/45]
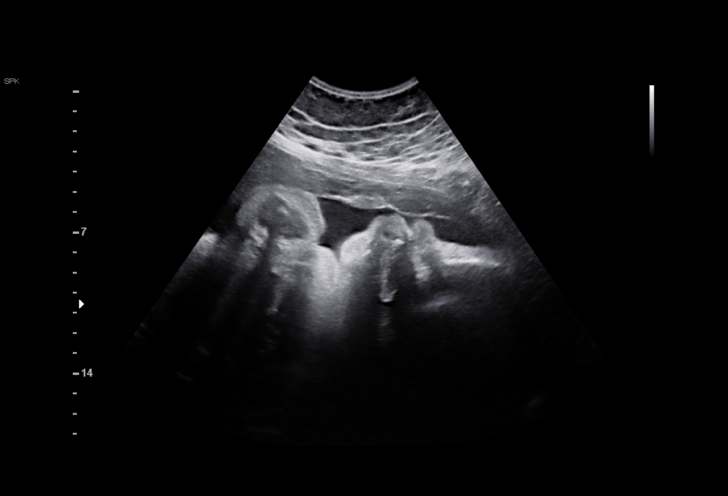

[14 of 28 positions shown; findings below may reference images not displayed]

for [REDACTED]care
Ref. Address:     [id number]

Indications

34 weeks gestation of pregnancy
Obesity complicating pregnancy, second
trimester
Rh negative state in antepartum
Uterine size-date discrepancy, third trimester
OB History

Blood Type:            Height:  5'4"   Weight (lb):  235       BMI:
Gravidity:    2         Term:   1        Prem:   0        SAB:   0
TOP:          0       Ectopic:  0        Living: 1
Fetal Evaluation

Num Of Fetuses:     1
Fetal Heart         140
Rate(bpm):
Cardiac Activity:   Observed
Presentation:       Cephalic
Placenta:           Anterior, above cervical os

Amniotic Fluid
AFI FV:      Subjectively within normal limits

AFI Sum(cm)     %Tile       Largest Pocket(cm)
9.56            17

RUQ(cm)       RLQ(cm)       LUQ(cm)        LLQ(cm)
5.19
Biometry

BPD:      95.4  mm     G. Age:  38w 6d       > 99  %    CI:        85.58   %    70 - 86
FL/HC:      21.3   %    20.1 -
HC:      324.8  mm     G. Age:  36w 5d         71  %    HC/AC:      0.95        0.93 -
AC:      342.5  mm     G. Age:  38w 1d       > 97  %    FL/BPD:     72.4   %    71 - 87
FL:       69.1  mm     G. Age:  35w 3d         65  %    FL/AC:      20.2   %    20 - 24
HUM:      66.1  mm     G. Age:  38w 3d       > 95  %

Est. FW:    6887  gm      7 lb 2 oz   > 90  %
Gestational Age

LMP:           35w 5d        Date:  05/08/16                 EDD:   02/12/17
U/S Today:     37w 2d                                        EDD:   02/01/17
Best:          34w 4d     Det. By:  Early Ultrasound         EDD:   02/20/17
(07/19/16)
Anatomy

Cranium:               Appears normal         Aortic Arch:            Previously seen
Cavum:                 Appears normal         Ductal Arch:            Previously seen
Ventricles:            Appears normal         Diaphragm:              Appears normal
Choroid Plexus:        Previously seen        Stomach:                Appears normal, left
sided
Cerebellum:            Previously seen        Abdomen:                Appears normal
Posterior Fossa:       Previously seen        Abdominal Wall:         Previously seen
Nuchal Fold:           Previously seen        Cord Vessels:           Previously seen
Face:                  Orbits and profile     Kidneys:                Appear normal
previously seen
Lips:                  Appears normal         Bladder:                Appears normal
Thoracic:              Previously seen        Spine:                  Limited views
appear normal
Heart:                 Appears normal         Upper Extremities:      Previously seen
(4CH, axis, and situs
RVOT:                  Previously seen        Lower Extremities:      Previously seen
LVOT:                  Previously seen

Other:  Fetus appears to be a male. Heels and Right 5th digit previously
visualized. Nasal bone visualized. Technically difficult due to maternal
habitus and fetal position.
Impression

Singleton intrauterine pregnancy at 34+4 weeks with a size-
dates discrepancy
Interval review of the anatomy shows no sonographic
markers for aneuploidy or structural anomalies
However, evaluations should be considered suboptimal
secondary to late gestational age and maternal body habitus
Amniotic fluid volume is normal with an AFI of
Estimated fetal weight is 3220g which is >90th percentile for
growth. Abdominal circumfrence is in >97th percentile
Recommendations

LGA infant. Kurian review glucose testing to confirm no GDM.
consider repeat scan for growth in 3-4 weeks

## 2024-08-01 ENCOUNTER — Other Ambulatory Visit (HOSPITAL_BASED_OUTPATIENT_CLINIC_OR_DEPARTMENT_OTHER): Payer: Self-pay

## 2024-08-01 ENCOUNTER — Encounter (HOSPITAL_BASED_OUTPATIENT_CLINIC_OR_DEPARTMENT_OTHER): Payer: Self-pay

## 2024-08-01 ENCOUNTER — Ambulatory Visit (HOSPITAL_BASED_OUTPATIENT_CLINIC_OR_DEPARTMENT_OTHER)
Admission: RE | Admit: 2024-08-01 | Discharge: 2024-08-01 | Disposition: A | Discharge status: Home / Self Care | Source: Ambulatory Visit | Attending: Nurse Practitioner | Admitting: Nurse Practitioner

## 2024-08-01 VITALS — BP 127/90 | HR 87 | Temp 99.1°F | Resp 20

## 2024-08-01 DIAGNOSIS — N898 Other specified noninflammatory disorders of vagina: Secondary | ICD-10-CM | POA: Insufficient documentation

## 2024-08-01 DIAGNOSIS — R300 Dysuria: Secondary | ICD-10-CM | POA: Insufficient documentation

## 2024-08-01 DIAGNOSIS — N760 Acute vaginitis: Secondary | ICD-10-CM | POA: Diagnosis present

## 2024-08-01 LAB — POCT URINE DIPSTICK
Bilirubin, UA: NEGATIVE
Glucose, UA: NEGATIVE mg/dL
Ketones, POC UA: NEGATIVE mg/dL
Leukocytes, UA: NEGATIVE
Nitrite, UA: NEGATIVE
Protein Ur, POC: NEGATIVE mg/dL
Spec Grav, UA: 1.01 (ref 1.010–1.025)
Urobilinogen, UA: 0.2 U/dL
pH, UA: 6.5 (ref 5.0–8.0)

## 2024-08-01 LAB — POCT URINE PREGNANCY: Preg Test, Ur: NEGATIVE

## 2024-08-01 MED ORDER — FLUCONAZOLE 150 MG PO TABS
150.0000 mg | ORAL_TABLET | ORAL | 0 refills | Status: AC
  Filled 2024-08-01: qty 2, 6d supply, fill #0

## 2024-08-01 MED ORDER — METRONIDAZOLE 500 MG PO TABS
500.0000 mg | ORAL_TABLET | Freq: Two times a day (BID) | ORAL | 0 refills | Status: AC
  Filled 2024-08-01: qty 14, 7d supply, fill #0

## 2024-08-01 NOTE — ED Triage Notes (Signed)
 Pt c/o vaginal itching and discharge-white for the last week. Pt has tried monostat with no relief. She is also having dysuria, pelvic discomfort (the first 2 days), slight urinary frequency.

## 2024-08-01 NOTE — ED Provider Notes (Signed)
 [facility]    CSN: [id number] Arrival date & time: 08/01/24  1501      History   Chief Complaint Chief Complaint  Patient presents with   Vaginal Itching    Entered by patient    HPI Kristine Cordova is a 33 y.o. female.   Discussed the use of AI scribe software for clinical note transcription with the patient, who gave verbal consent to proceed.   The patient presents with a one-week history of vaginal itching and white vaginal discharge. She initially tried over-the-counter Monistat without relief. Over the past two days, she has developed new symptoms including lower abdominal and pelvic discomfort as well as burning with urination. She also noticed a change in vaginal odor yesterday along with mild irritation. Her last menstrual period began on October 3rd. She has a history of tubal ligation. She is sexually active with one female partner within the past three months and does not use condoms. She denies fever, flank pain, nausea, vomiting, or abnormal vaginal bleeding.  The following sections of the patient's history were reviewed and updated as appropriate: allergies, current medications, past family history, past medical history, past social history, past surgical history, and problem list.      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   Maternal morbid obesity, antepartum (HCC) 10/01/2016   Rh negative state in antepartum period 08/08/2016   Supervision of other normal pregnancy, antepartum 08/06/2016   Group B streptococcus urinary tract infection affecting pregnancy 07/28/2016   Prehypertension 07/04/2016   Chronic GERD 03/26/2016    Past Surgical History:   Procedure Laterality Date   CHOLECYSTECTOMY     LAPAROSCOPIC TUBAL LIGATION Left 04/23/2017   Procedure: LAPAROSCOPIC TUBAL LIGATION - Filshie Clips;  Surgeon: Starla Harland BROCKS, MD;  Location: [facility];  Service: Gynecology;  Laterality: Left;   OOPHORECTOMY      OB History     Gravida  2   Para  2   Term  2   Preterm      AB      Living  2      SAB      IAB      Ectopic      Multiple  0   Live Births  1            Home Medications    Prior to Admission medications   Medication Sig Start Date End Date Taking? Authorizing Provider  baclofen (LIORESAL) 10 MG tablet Take 10 mg by mouth. 07/12/24 08/11/24 Yes [provider]  fluconazole (DIFLUCAN) 150 MG tablet Take 1 tablet (150 mg total) by mouth every 3 (three) days for 2 doses. 08/01/24 08/07/24 Yes Iola Lukes, FNP  metroNIDAZOLE  (FLAGYL ) 500 MG tablet Take 1 tablet (500 mg total) by mouth 2 (two) times daily. 08/01/24  Yes Iola Lukes, FNP  rosuvastatin (CRESTOR) 20 MG tablet Take 20 mg by mouth daily.    [provider]  SUMAtriptan (IMITREX) 100 MG tablet TAKE TABLET BY MOUTH AT ONSET OF HEADACHE, MAY REPEAT ONCE. MAX 2 TABS IN 24 HOURS    [provider]  valACYclovir  (VALTREX ) 500 MG tablet Take 500 mg by mouth  daily.    [provider]    Family History Family History  Problem Relation Age of Onset   Cancer Maternal Grandfather    Diabetes Paternal Grandmother     Social History Social History   Tobacco Use   Smoking status: Former    Current packs/day: 0.25    Average packs/day: 0.3 packs/day for 2.0 years (0.5 ttl pk-yrs)    Types: Cigarettes   Smokeless tobacco: Former  Building Services Engineer status: Never Used  Substance Use Topics   Alcohol use: No   Drug use: No     Allergies   Patient has no known allergies.   Review of Systems Review of Systems  Constitutional:  Negative for fever.  Gastrointestinal:  Negative for abdominal pain,  nausea and vomiting.  Genitourinary:  Positive for dysuria, frequency and vaginal discharge. Negative for menstrual problem (LMP - Oct 3).       Vaginal itching, irritation and odor   Musculoskeletal:  Negative for back pain.  All other systems reviewed and are negative.    Physical Exam Triage Vital Signs ED Triage Vitals  Encounter Vitals Group     BP 08/01/24 1513 (!) 127/90     Girls Systolic BP Percentile --      Girls Diastolic BP Percentile --      Boys Systolic BP Percentile --      Boys Diastolic BP Percentile --      Pulse Rate 08/01/24 1513 87     Resp 08/01/24 1513 20     Temp 08/01/24 1513 99.1 F (37.3 C)     Temp Source 08/01/24 1513 Oral     SpO2 08/01/24 1513 96 %     Weight --      Height --      Head Circumference --      Peak Flow --      Pain Score 08/01/24 1509 4     Pain Loc --      Pain Education --      Exclude from Growth Chart --    No data found.  Updated Vital Signs BP (!) 127/90 (BP Location: Right Arm)   Pulse 87   Temp 99.1 F (37.3 C) (Oral)   Resp 20   SpO2 96%   Breastfeeding No   Visual Acuity Right Eye Distance:   Left Eye Distance:   Bilateral Distance:    Right Eye Near:   Left Eye Near:    Bilateral Near:     Physical Exam Constitutional:      General: She is not in acute distress.    Appearance: Normal appearance. She is not ill-appearing, toxic-appearing or diaphoretic.  HENT:     Head: Normocephalic.     Nose: Nose normal.     Mouth/Throat:     Mouth: Mucous membranes are moist.  Eyes:     Conjunctiva/sclera: Conjunctivae normal.  Cardiovascular:     Rate and Rhythm: Normal rate.  Pulmonary:     Effort: Pulmonary effort is normal.  Abdominal:     Palpations: Abdomen is soft.  Genitourinary:    Comments: Deferred; patient performed self-swab for Aptima testing  Musculoskeletal:        General: Normal range of motion.     Cervical back: Normal range of motion and neck supple.  Skin:    General: Skin is  warm and dry.  Neurological:     General: No focal deficit present.     Mental Status:  She is alert and oriented to person, place, and time.  Psychiatric:        Mood and Affect: Mood normal.        Behavior: Behavior normal.      UC Treatments / Results  Labs (all labs ordered are listed, but only abnormal results are displayed) Labs Reviewed  POCT URINE DIPSTICK - Abnormal; Notable for the following components:      Result Value   Blood, UA trace-intact (*)    All other components within normal limits  POCT URINE PREGNANCY - Normal  URINE CULTURE  CERVICOVAGINAL ANCILLARY ONLY    EKG   Radiology No results found.  Procedures Procedures (including critical care time)  Medications Ordered in UC Medications - No data to display  Initial Impression / Assessment and Plan / UC Course  I have reviewed the triage vital signs and the nursing notes.  Pertinent labs & imaging results that were available during my care of the patient were reviewed by me and considered in my medical decision making (see chart for details).     Patient presents with vaginal discharge accompanied by dysuria, odor, irritation, and itching. UA without signs of acute UTI. Urine culture sent to confirm. Vaginal swabs were collected for gonorrhea, chlamydia, trichomonas, yeast, and bacterial vaginosis testing; results are pending. Empiric treatment initiated with metronidazole  (Flagyl ) and fluconazole (Diflucan). Further management will be guided by pending test results.  The patient was advised to maintain adequate hydration and to use barrier protection if sexually active to help reduce the risk of recurrent infection. She was informed that she will be contacted only if results are positive, though all results will be available for review through her MyChart account. She was instructed to follow up with her primary care provider or gynecologist if symptoms do not improve with treatment and to seek  medical attention sooner if she develops fever, pelvic pain, or worsening discharge.  Final Clinical Impressions(s) / UC Diagnoses   Final diagnoses:  Vaginal itching  Dysuria  Vaginitis and vulvovaginitis     Discharge Instructions      You were seen today for vaginal discharge with irritation, itching, burning, and urinary discomfort. Your urine test today did not show signs of a urinary tract infection, but a urine culture was sent to confirm. Vaginal swabs were collected to test for yeast infection, bacterial vaginosis, and sexually transmitted infections including gonorrhea, chlamydia, and trichomonas. These results take a short time to return. You were started on treatment today with metronidazole  (Flagyl ) and fluconazole (Diflucan) to help treat the most common causes of these symptoms. Take the medications exactly as directed and avoid alcohol while taking Flagyl . Drink plenty of water and consider using barrier protection such as condoms if you are sexually active to help prevent reinfection or transmission. You will be contacted only if any of your results require a change in treatment, but you may view all results in your MyChart account once finalized. If your symptoms do not start to improve after a few days, or if they return after completing treatment, please follow up with your primary care provider or gynecologist. If you develop fever, pelvic or lower abdominal pain, significant worsening of discharge, vomiting, or feel very unwell, please seek care right away or go to the emergency department.      ED Prescriptions     Medication Sig Dispense Auth. Provider   metroNIDAZOLE  (FLAGYL ) 500 MG tablet Take 1 tablet (500 mg total) by mouth 2 (two)  times daily. 14 tablet Iola Lukes, FNP   fluconazole (DIFLUCAN) 150 MG tablet Take 1 tablet (150 mg total) by mouth every 3 (three) days for 2 doses. 2 tablet Iola Lukes, FNP      PDMP not reviewed this encounter.    Iola Lukes, [place] 08/01/24 [phone]

## 2024-08-01 NOTE — Discharge Instructions (Addendum)
 You were seen today for vaginal discharge with irritation, itching, burning, and urinary discomfort. Your urine test today did not show signs of a urinary tract infection, but a urine culture was sent to confirm. Vaginal swabs were collected to test for yeast infection, bacterial vaginosis, and sexually transmitted infections including gonorrhea, chlamydia, and trichomonas. These results take a short time to return. You were started on treatment today with metronidazole  (Flagyl ) and fluconazole (Diflucan) to help treat the most common causes of these symptoms. Take the medications exactly as directed and avoid alcohol while taking Flagyl . Drink plenty of water and consider using barrier protection such as condoms if you are sexually active to help prevent reinfection or transmission. You will be contacted only if any of your results require a change in treatment, but you may view all results in your MyChart account once finalized. If your symptoms do not start to improve after a few days, or if they return after completing treatment, please follow up with your primary care provider or gynecologist. If you develop fever, pelvic or lower abdominal pain, significant worsening of discharge, vomiting, or feel very unwell, please seek care right away or go to the emergency department.

## 2024-08-02 ENCOUNTER — Ambulatory Visit (HOSPITAL_COMMUNITY): Payer: Self-pay

## 2024-08-02 LAB — CERVICOVAGINAL ANCILLARY ONLY
Bacterial Vaginitis (gardnerella): POSITIVE — AB
Candida Glabrata: NEGATIVE
Candida Vaginitis: NEGATIVE
Chlamydia: NEGATIVE
Comment: NEGATIVE
Comment: NEGATIVE
Comment: NEGATIVE
Comment: NEGATIVE
Comment: NEGATIVE
Comment: NORMAL
Neisseria Gonorrhea: NEGATIVE
Trichomonas: NEGATIVE

## 2024-08-02 LAB — URINE CULTURE: Culture: NO GROWTH
